# Patient Record
Sex: Female | Born: 2000 | Race: White | Hispanic: No | Marital: Single | Smoking: Current every day smoker
Health system: Southern US, Community
[De-identification: ages and names within clinical notes are randomized; demographics above are authoritative.]

## PROBLEM LIST (undated history)

## (undated) ENCOUNTER — Emergency Department (HOSPITAL_COMMUNITY): Payer: 59

## (undated) ENCOUNTER — Inpatient Hospital Stay (HOSPITAL_COMMUNITY): Payer: Self-pay

## (undated) DIAGNOSIS — Z789 Other specified health status: Secondary | ICD-10-CM

---
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
## 2018-10-31 ENCOUNTER — Encounter: Payer: 59 | Admitting: Obstetrics & Gynecology

## 2018-11-25 ENCOUNTER — Encounter: Payer: Self-pay | Admitting: Obstetrics & Gynecology

## 2018-11-25 ENCOUNTER — Ambulatory Visit: Payer: 59 | Admitting: Obstetrics & Gynecology

## 2018-11-25 ENCOUNTER — Other Ambulatory Visit: Payer: Self-pay

## 2018-11-25 VITALS — BP 110/68 | HR 64 | Resp 14 | Ht 64.25 in | Wt 169.4 lb

## 2018-11-25 DIAGNOSIS — Z309 Encounter for contraceptive management, unspecified: Secondary | ICD-10-CM | POA: Diagnosis not present

## 2018-11-25 DIAGNOSIS — Z202 Contact with and (suspected) exposure to infections with a predominantly sexual mode of transmission: Secondary | ICD-10-CM

## 2018-11-25 NOTE — Progress Notes (Signed)
17 y.o. G0P0000 [name] here for new patient annual exam.  SA.  Desires contraception.  Has been on OCPs but gained weight.  Does not want anything with estrogen in it.  Options reviewed including POPs, Nuva ring, Depo Provera, Nexplanon, and IUDs.  Procedures for placement, risks and benefits reviewed.  Questions answered.  Always uses condoms but has had multiple partners.  Willing to do STD testing on urine today.   Patient's last menstrual period was 11/02/2018 (exact date).          Sexually active: Yes.    The current method of family planning is condoms every time.    Exercising: Yes.    class Smoker:  yes  Health Maintenance: Pap:  Never Gardasil: completed  TDaP:  2019 Screening Labs: Here today    reports that she has been smoking e-cigarettes. She has never used smokeless tobacco. She reports current alcohol use of about 3.0 standard drinks of alcohol per week. She reports that she does not use drugs.  History reviewed. No pertinent past medical history.  Past Surgical History:  Procedure Laterality Date  . TONSILLECTOMY  2018  . WISDOM TOOTH EXTRACTION  2018    Current Outpatient Medications  Medication Sig Dispense Refill  . methylphenidate 27 MG PO CR tablet Take 27 mg by mouth every morning.     No current facility-administered medications for this visit.     History reviewed. No pertinent family history.  Review of Systems  All other systems reviewed and are negative.   Exam:   BP 110/68 (BP Location: Right Arm, Patient Position: Sitting, Cuff Size: Normal)   Pulse 64   Resp 14   Ht 5' 4.25" (1.632 m)   Wt 169 lb 6.4 oz (76.8 kg)   LMP 11/02/2018 (Exact Date)   BMI 28.85 kg/m   Height: 5' 4.25" (163.2 cm)  Ht Readings from Last 3 Encounters:  11/25/18 5' 4.25" (1.632 m) (49 %, Z= -0.02)*  03/25/17 5\' 5"  (1.651 m) (62 %, Z= 0.30)*   * Growth percentiles are based on CDC (Girls, 2-20 Years) data.    General appearance:  alert, cooperative and appears stated age Head: Normocephalic, without obvious abnormality, atraumatic Lungs: clear to auscultation bilaterally Heart: regular rate and rhythm No other exam performed today  A:  Desires contraception with Nexplanon  P:   Pt knows to call with onset of cycle for placement.   GC/Chl testing obtained today.

## 2018-11-30 LAB — CHLAMYDIA/GONOCOCCUS/TRICHOMONAS, NAA
CHLAMYDIA BY NAA: POSITIVE — AB
Gonococcus by NAA: NEGATIVE
Trich vag by NAA: NEGATIVE

## 2018-12-02 ENCOUNTER — Telehealth: Payer: Self-pay | Admitting: Obstetrics & Gynecology

## 2018-12-02 DIAGNOSIS — Z30017 Encounter for initial prescription of implantable subdermal contraceptive: Secondary | ICD-10-CM

## 2018-12-02 MED ORDER — AZITHROMYCIN 1 G PO PACK: 1.0000 | PACK | Freq: Once | ORAL | 0 refills | Status: AC

## 2018-12-02 NOTE — Telephone Encounter (Signed)
-----   Message from Jerene BearsMary S Miller, MD sent at 12/02/2018  8:38 AM EST ----- Please let pt know her chlamydia test was positive.  GC and trich negative.  Need to treat with Azithromax 1 gram po x 1, repeat to HD, and repeat testing in 3 months.  Partner can be treated as well if she desires.  She does need to return for HIV, Hep B, Hep C, and RPR blood work as well.  I can place order if you let me know when she is scheduled for the blood work.

## 2018-12-02 NOTE — Telephone Encounter (Signed)
Patient's mother Krystal Simmons is calling to schedule her daughters nexplanon insertion. The patient has started her cycle today for insertion. DPR on file to talk with mom.

## 2018-12-02 NOTE — Telephone Encounter (Signed)
Spoke with patient.  1. LMP 12/01/18, SA, condoms for contraceptive. Request to schedule nexplanon insertion. Nexplanon insertion scheduled for 12/23 at 3pm with Dr. Hyacinth MeekerMiller. Advised to take Motrin 800 mg with food and water one hour before procedure. Order placed for precert.   2. Advised of results as seen below per Dr. Hyacinth MeekerMiller. Reviewed EPT, patient declines. Patient will plan to have additional lab at [facility] on 12/23. Azithromycin to verified pharmacy. Advised to abstain from intercourse until both have been treated and then 7 days after, then condoms. Patient declines to schedule 3 mo TOC at this time, will be away at college.   Confidential Communicable [facility]   Patient verbalizes understanding and is agreeable.   [facility]Miller [facility] will be at away at college.   Cc: [facility] [place] Davis

## 2018-12-05 ENCOUNTER — Ambulatory Visit (INDEPENDENT_AMBULATORY_CARE_PROVIDER_SITE_OTHER): Payer: 59 | Admitting: Obstetrics & Gynecology

## 2018-12-05 ENCOUNTER — Encounter: Payer: Self-pay | Admitting: Obstetrics & Gynecology

## 2018-12-05 VITALS — BP 118/60 | HR 62 | Resp 14 | Ht 65.0 in | Wt 174.0 lb

## 2018-12-05 DIAGNOSIS — Z01812 Encounter for preprocedural laboratory examination: Secondary | ICD-10-CM | POA: Diagnosis not present

## 2018-12-05 DIAGNOSIS — Z30017 Encounter for initial prescription of implantable subdermal contraceptive: Secondary | ICD-10-CM | POA: Diagnosis not present

## 2018-12-05 DIAGNOSIS — A749 Chlamydial infection, unspecified: Secondary | ICD-10-CM

## 2018-12-05 DIAGNOSIS — Z8619 Personal history of other infectious and parasitic diseases: Secondary | ICD-10-CM | POA: Insufficient documentation

## 2018-12-05 LAB — POCT URINE PREGNANCY: PREG TEST UR: NEGATIVE

## 2018-12-05 NOTE — Progress Notes (Signed)
17 y.o. G0P0000 Caucasian Single female presents for Nexplanon insertion. Options for contraception have been reviewed including risks and benefits.  Pt feels this is best option for her.  LMP:  12/01/18  Current contraception:  condoms  UPT:  Negative today STD testing done last week.  Has +chlamydia testing.  Has been treated.  Blood work being done today.  After all questions were answered, consent was obtained.    No past medical history on file.  Past Surgical History:  Procedure Laterality Date  . TONSILLECTOMY  2018  . WISDOM TOOTH EXTRACTION  2018    Current Outpatient Medications on File Prior to Visit  Medication Sig Dispense Refill  . methylphenidate 27 MG PO CR tablet Take 27 mg by mouth every morning.     No current facility-administered medications on file prior to visit.    No Known Allergies  Vitals:   12/05/18 1446  BP: 118/60  Pulse: 62  Resp: 14   Physical Exam  Constitutional: She is oriented to person, place, and time. She appears well-developed and well-nourished.  Neurological: She is alert and oriented to person, place, and time.  Skin: Skin is warm and dry.  Psychiatric: She has a normal mood and affect.    Procedure: Patient placed supine on exam table with her left arm flexed at the elbow and externally rotated.  The insertion site was identified on the inner side of upper arm.  Two marks were made 8cm above the medial epicondyle of the humerus.  The first mark for insertion and the second mark 4cm from the first. The insertion sited was cleansed with betadine solution. 3 ml of 1% Lidocaine was injected along the track of the insertion site. Lot:  [id number].  Exp:  04/23.  The Nexplanon, under sterile conditions, was inserted in left arm. The implant was palpated in the arm after insertion. A small adhesive bandage placed over insertion site. Arm was wrapped with guaze dressing.  The patient palpated the device for monitoring of the device.  No active  bleeding was noted.  A pressure bandage was place over the site.  Pt tolerated procedure well.  Assessment: Nexplanon insertion for contraceptive needs Recent positive chlamydia testing  Plan  Pt knows to call back with nay questions or concerns.  She is aware of signs/symptoms to watch for over the next few weeks. Pt aware Nexplanon should not be removed any later than 12/05/2022 HIV, RPR, Hep B and C obtained today as well.

## 2018-12-06 LAB — HEPATITIS C ANTIBODY

## 2018-12-06 LAB — HEP, RPR, HIV PANEL
HIV SCREEN 4TH GENERATION: NONREACTIVE
Hepatitis B Surface Ag: NEGATIVE
RPR: NONREACTIVE

## 2019-01-06 ENCOUNTER — Encounter: Payer: Self-pay | Admitting: Obstetrics & Gynecology

## 2019-01-24 ENCOUNTER — Telehealth: Payer: Self-pay | Admitting: *Deleted

## 2019-01-24 NOTE — Telephone Encounter (Signed)
Return call to patient.  States she started a period today.  Has not had vaginal bleeding since Nexplanon inserted by Dr. Hyacinth MeekerMiller on 12/06/19.  Calling to ensure okay to have vaginal bleeding.   Discussed bleeding with nexplanon. May experience longer or shorter bleeding during your periods or have no bleeding at all. The time between periods may vary, and in between periods you may also have spotting.   Pt denies heavy vaginal bleeding. Denies risk for STD or STD exposure. Denies pain.   Advised patient to calendar all bleeding and if develops any concerns about vaginal bleeding to call back for appointment with Dr. Hyacinth MeekerMiller.  Advised patient to call back or seek immediate medical care if soaking through 1 pad/tampon per hour for two hours or if becomes symptomatic with sob, chest pain, fatigued, lightheaded, or weakness.  Patient verbalized understanding.   Patient verbalizes understanding of all instructions.  Will call back as directed.  Okay to close?

## 2019-01-24 NOTE — Telephone Encounter (Signed)
Yes, ok to close encounter. 

## 2019-01-24 NOTE — Telephone Encounter (Signed)
Patient has questions about her nexplanon.

## 2019-10-20 ENCOUNTER — Telehealth: Payer: Self-pay | Admitting: Obstetrics & Gynecology

## 2019-10-20 DIAGNOSIS — Z3046 Encounter for surveillance of implantable subdermal contraceptive: Secondary | ICD-10-CM

## 2019-10-20 NOTE — Telephone Encounter (Signed)
Patient canceled her Nexplanon removal 10/24/19 and will call later to reschedule.

## 2019-10-20 NOTE — Telephone Encounter (Signed)
Patient would like to have her nexplanon removed. States she will only be in town 11/6 - 11/11.

## 2019-10-20 NOTE — Telephone Encounter (Signed)
Spoke with pts mother Manuela Schwartz, ok per DPR. Pt wanting nexplanon removed d/t acne and mood swings. Pt using condoms as backup method. Wants to discuss other BCM at appt. [facility] scheduled for Nexplanon removal 11/10 at 10am.   Routing to provider for final review. Patient is agreeable to disposition. Will close encounter.  Cc: Weston Brass- order placed.

## 2019-10-24 ENCOUNTER — Ambulatory Visit: Payer: Self-pay | Admitting: Obstetrics & Gynecology

## 2019-11-15 ENCOUNTER — Telehealth: Payer: Self-pay | Admitting: Obstetrics & Gynecology

## 2019-11-15 NOTE — Telephone Encounter (Signed)
Call to patient. Patient states she will only be available the week of December 14th for nexplanon removal due to college schedule and travel plans for the holidays. Patient states she does not desire to be on any birth control after nexplanon removal to avoid mood swings and work on weight loss. States she will use condoms for contraception. Nexplanon removal scheduled for 11-30-2019 at 1530. Patient agreeable to date and time of appointment. Future order is present for nexplanon removal.   Routing to provider and will close encounter.   [place]

## 2019-11-15 NOTE — Telephone Encounter (Signed)
Patient's mother, Krystal Simmons (OK per DPR), is calling to schedule Nexplanon removal for daughter. Patient's mother stated that she will be home from college beginning December 14th, through the holidays and would like to get it removed at that time. Return call may be placed to patient or patient's mother for scheduling.

## 2019-11-28 ENCOUNTER — Other Ambulatory Visit: Payer: Self-pay

## 2019-11-30 ENCOUNTER — Ambulatory Visit (INDEPENDENT_AMBULATORY_CARE_PROVIDER_SITE_OTHER): Payer: 59 | Admitting: Obstetrics & Gynecology

## 2019-11-30 ENCOUNTER — Other Ambulatory Visit: Payer: Self-pay

## 2019-11-30 ENCOUNTER — Encounter: Payer: Self-pay | Admitting: Obstetrics & Gynecology

## 2019-11-30 DIAGNOSIS — Z3046 Encounter for surveillance of implantable subdermal contraceptive: Secondary | ICD-10-CM

## 2019-12-03 NOTE — Progress Notes (Signed)
18 y.o. G0P0000 Caucasian Single female presents for Nexplanon removal.  Pt has been experiencing weight gain and just was device removal to see if this will help with weight concerns.  Is SA and is going to use condoms for [facility].    She did have positive chlamydia testing but did not return here for TOC.  Confirmed today that she did this on campus about three months after initial diagnosis.  Pt reports this was indeed negative.  Using E cigarettes.  LMP:  11/30/2019   After all questions were answered, consent was obtained.    History reviewed. No pertinent past medical history.  Past Surgical History:  Procedure Laterality Date  . TONSILLECTOMY  2018  . WISDOM TOOTH EXTRACTION  2018    Current Outpatient Medications on File Prior to Visit  Medication Sig Dispense Refill  . methylphenidate 27 MG PO CR tablet Take 27 mg by mouth every morning.     No current facility-administered medications on file prior to visit.   No Known Allergies  Vitals:   11/30/19 1529  BP: 110/60  Pulse: 72  Resp: 12  Temp: 98.1 F (36.7 C)   Review of Systems  All other systems reviewed and are negative.   Physical Exam  Constitutional: She is oriented to person, place, and time. She appears well-developed and well-nourished.  Neurological: She is alert and oriented to person, place, and time.  Skin: Skin is warm and dry.  Nexplanon easily palpated in underside of left arm.  Psychiatric: She has a normal mood and affect.   Procedure: Patient placed supine on exam table with her left arm flexed at the elbow and externally rotated. The insertion site was identified on the inner side of upper arm.  The device was palpated.  Incision site for removal was noted.  The removal site was cleansed with betadine solution and 1 ml of 1% Lidocaine was injected at the incision site beneath the tip of the Nexplanon rid.  A small 2 mm incision was made at the distal end of the implant.  The Nexaplon implant was  grasped with curved forceps and the capsule opened with #11 blade.  Then the Nexplanon device could be removed gently and intact.  The implant was shown to the patient and discarded.  Pressure was applied for excellent hemostasis.  The incision was closed with a steri strip.  No active bleeding was noted.  A pressure bandage was place over the site.  Assessment: Nexplanon removal H/o chlamydia with negative testing on campus   Plan  Pt knows to call back with nay questions or concerns.  She is aware of signs/symptoms to watch for over the next few days for infection.

## 2020-02-20 HISTORY — PX: BREAST REDUCTION SURGERY: SHX8

## 2020-03-19 ENCOUNTER — Telehealth: Payer: Self-pay | Admitting: Obstetrics & Gynecology

## 2020-03-19 ENCOUNTER — Telehealth: Payer: Self-pay

## 2020-03-19 DIAGNOSIS — Z309 Encounter for contraceptive management, unspecified: Secondary | ICD-10-CM

## 2020-03-19 NOTE — Telephone Encounter (Signed)
Reviewed with Dr Hyacinth Meeker. Agreeable to annual and pl for Nexplannon insertion pending assessment.   Call to patient and appointment scheduled.   Routing to provider. Encounter closed.

## 2020-03-19 NOTE — Telephone Encounter (Signed)
Call placed to convey benefits. Spoke with the patient and conveyed the benefits. Patient understands/agreeable with the benefits. Patient is aware of the cancellation policy. Appointment scheduled 03/20/20.

## 2020-03-19 NOTE — Telephone Encounter (Signed)
Patient called in regards to making an appointment to get Nexplanon implant. Patent stated she would like to have an appointment this week due to being near the end of her cycle.

## 2020-03-19 NOTE — Telephone Encounter (Signed)
Return call to patient at 1515. Patient states this is Cycle day 4 and needs appointment for Nexplanon re-insertion tomorrow.  Nexplannon removed  11-30-2019 due to weight gain. Reports had had breast reduction and is doing well post op. Weight is now stable.  Last annual 11-25-2018. Advised will review with provider and call her back.  Also will have to check insurance.

## 2020-03-20 ENCOUNTER — Ambulatory Visit (INDEPENDENT_AMBULATORY_CARE_PROVIDER_SITE_OTHER): Payer: 59 | Admitting: Obstetrics & Gynecology

## 2020-03-20 ENCOUNTER — Other Ambulatory Visit (HOSPITAL_COMMUNITY)
Admission: RE | Admit: 2020-03-20 | Discharge: 2020-03-20 | Disposition: A | Discharge status: Home / Self Care | Payer: 59 | Source: Ambulatory Visit | Attending: Obstetrics & Gynecology | Admitting: Obstetrics & Gynecology

## 2020-03-20 ENCOUNTER — Other Ambulatory Visit: Payer: Self-pay

## 2020-03-20 ENCOUNTER — Encounter: Payer: Self-pay | Admitting: Obstetrics & Gynecology

## 2020-03-20 VITALS — BP 110/78 | HR 76 | Temp 97.9°F | Resp 20 | Ht 66.0 in

## 2020-03-20 DIAGNOSIS — Z30017 Encounter for initial prescription of implantable subdermal contraceptive: Secondary | ICD-10-CM | POA: Diagnosis not present

## 2020-03-20 DIAGNOSIS — Z01419 Encounter for gynecological examination (general) (routine) without abnormal findings: Secondary | ICD-10-CM

## 2020-03-20 DIAGNOSIS — Z01812 Encounter for preprocedural laboratory examination: Secondary | ICD-10-CM

## 2020-03-20 DIAGNOSIS — Z124 Encounter for screening for malignant neoplasm of cervix: Secondary | ICD-10-CM

## 2020-03-20 DIAGNOSIS — Z309 Encounter for contraceptive management, unspecified: Secondary | ICD-10-CM

## 2020-03-20 LAB — POCT URINE PREGNANCY: Preg Test, Ur: NEGATIVE

## 2020-03-20 NOTE — Progress Notes (Signed)
19 y.o. G0P0000 [name] here for annual exam and Nexplanon insertion.    Had breast reduction on 02/20/2020 with Dr. Stephanie Coup.  Had to have an area of tissue opened.  Wants me to look at this today.  Has follow up on Monday.  There is drainage.    Cycles have been regular since Nexplanon removed.    Had STD testing in January at [facility] that pt reports was negative.  Not SA since then.  Does plan to be SA and would like contraception.  LMP 03/16/2020.  Today is day 5 of cycle.     Patient's last menstrual period was 03/16/2020 (exact date).          Sexually active: Yes.    The current method of family planning is condoms everytime.    Exercising: No.  The patient does not participate in regular exercise at present.--just had breast surgery Smoker:  yes  Health Maintenance: Pap:  n/a History of abnormal Pap:  n/a TDaP:  2019 Hep C testing: 12/05/18 Neg  Screening Labs: discuss today   reports that she has been smoking. She has never used smokeless tobacco. She reports current alcohol use of about 1.0 standard drinks of alcohol per week. She reports that she does not use drugs.  History reviewed. No pertinent past medical history.  Past Surgical History:  Procedure Laterality Date  . BREAST SURGERY  02/20/2020   Breast reduction  . TONSILLECTOMY  2018  . WISDOM TOOTH EXTRACTION  2018    No current outpatient medications on file.   No current facility-administered medications for this visit.    History reviewed. No pertinent family history.  Review of Systems  All other systems reviewed and are negative.   Exam:   BP 110/78 (Cuff Size: Large)   Pulse 76   Temp 97.9 F (36.6 C) (Temporal)   Resp 20   Ht 5\' 6"  (1.676 m)   LMP 03/16/2020 (Exact Date)   BMI 30.52 kg/m   Height: 5\' 6"  (167.6 cm)  Ht Readings from Last 3 Encounters:  03/20/20 5\' 6"  (1.676 m)  12/05/18 5\' 5"  (1.651 m) (61 %, Z= 0.28)*  11/25/18 5' 4.25" (1.632 m) (49 %, Z= -0.02)*   *  Growth percentiles are based on CDC (Girls, 2-20 Years) data.    General appearance: alert, cooperative and appears stated age Head: Normocephalic, without obvious abnormality, atraumatic Neck: no adenopathy, supple, symmetrical, trachea midline and thyroid normal to inspection and palpation Lungs: clear to auscultation bilaterally Breasts:  Left scars from recent reduction healing well, right mid portion of breast with superficial absence of epidermis but underlying tissue healing well, whitish suture hanging loosely and removed without difficulty Heart: regular rate and rhythm Abdomen: soft, non-tender; bowel sounds normal; no masses,  no organomegaly Extremities: extremities normal, atraumatic, no cyanosis or edema Skin: Skin color, texture, turgor normal. No rashes or lesions Lymph nodes: Cervical, supraclavicular, and axillary nodes normal. No abnormal inguinal nodes palpated Neurologic: Grossly normal   Pelvic: External genitalia:  no lesions              Urethra:  normal appearing urethra with no masses, tenderness or lesions              Bartholins and Skenes: normal                 Vagina: normal appearing vagina with normal color and discharge, no lesions  Cervix: no lesions              Pap taken: Yes.   Bimanual Exam:  Uterus:  normal size, contour, position, consistency, mobility, non-tender              Adnexa: normal adnexa and no mass, fullness, tenderness               Rectovaginal: Confirms               Anus:  normal sphincter tone, no lesions  Procedure:  After informed consent obtained Patient placed supine on exam table with her left arm flexed at the elbow and externally rotated.  Sterile prep with Betadine was applied.  Area for insertion identified.  73ml of 1% Lidocaine was injected at the insertion site.  Lot: [id number].  Exp:  03/2021.  Device was obtained and skin was punctured.  While elevating the skin, the Nexplanon device was passed under the  skin.  Nexplanon insert released and device removed.  Minimal bleeding was noted.  Steri-strips and pressure dressing applied.  No active bleeding was noted.  Pt palpated distal end of device prior to applying dressing.  Pt tolerated procedure well.  Nexplanon Lot:  [id number].  Exp: 03/15/2022  Chaperone, Zenovia Jordan, CMA, was present for exam.  A:  Well Woman with normal exam H/o chlamydia with negative follow up at [facility], repeat negative testing per pt again 12/2019 Recent breast reduction with concerns about how breast is healing, reassured today Desires Nexplanon insertion today E cigarette use  P:   Mammogram screening will start around age 67 pap smear obtained today Pt declined repeat STD testing Nexplanon insertion per note above.  Removal due in 3 years. Pt has follow up with Dr. Benna Dunks on Monday Return annually or prn

## 2020-03-22 LAB — CYTOLOGY - PAP
Diagnosis: NEGATIVE
Diagnosis: REACTIVE

## 2020-06-05 ENCOUNTER — Telehealth: Payer: Self-pay | Admitting: Obstetrics & Gynecology

## 2020-06-05 DIAGNOSIS — Z3046 Encounter for surveillance of implantable subdermal contraceptive: Secondary | ICD-10-CM

## 2020-06-05 NOTE — Telephone Encounter (Signed)
Patient would like to have her Nexplanon removed.

## 2020-06-05 NOTE — Telephone Encounter (Signed)
Ok to proceed as scheduled.  Thanks for information and update.  Ok to close encounter.

## 2020-06-05 NOTE — Telephone Encounter (Signed)
Call placed to convey benefits. Spoke with the patient and conveyed the benefits. Patient understands/agreeable with the benefits. Patient is aware of the cancellation policy. Appointment scheduled 06/24/20.

## 2020-06-05 NOTE — Telephone Encounter (Signed)
AEX 03/20/20 with Nexplanon insertion. H/o chlamydia Recent breast reduction surgery 02/20/20 E-cigarette use   Spoke with pt. Pt states wanting to have nexplanon removed. Pt states "miss having cycles and wanting to take a break to give body time to regulate" Pt denies any other concerns with BCM at this time. Pt unsure if she wants another birth control at this time. Pt states is SA and is using protection.  Pt scheduled for Nexplanon removal on 06/24/20 at 4:45 pm per pt's request due to out of town on vacation and no other available spots on schedule. Pt advised will review appt with Dr Hyacinth Meeker and return call if needs to change appt. Pt agreeable.  Pt verbalized understanding of date and time of appt.   Ok to proceed as scheduled?    Routing to Dr Hyacinth Meeker.   Cc: Hedda Slade for precert Order placed.

## 2020-06-19 ENCOUNTER — Telehealth: Payer: Self-pay | Admitting: Obstetrics & Gynecology

## 2020-06-19 NOTE — Telephone Encounter (Signed)
Patient canceled her nexplanon removal 06/24/20 and did not wish to reschedule.

## 2020-06-24 ENCOUNTER — Ambulatory Visit: Payer: Self-pay | Admitting: Obstetrics & Gynecology

## 2020-10-07 ENCOUNTER — Telehealth: Payer: Self-pay

## 2020-10-07 NOTE — Telephone Encounter (Signed)
Patient has questions regarding birth control.

## 2020-10-07 NOTE — Telephone Encounter (Signed)
[facility] 03/20/20 Nexplanon removed 11/2019, reinserted 03/20/20 Hx Chlamydia   Spoke with pt. Pt states having vaginal spotting that is now turning into more of a cycle bleed. Pt states has not had any spotting or cycle since having Nexplanon reinserted on 03/20/20 at [facility] appt. Pt states started with vaginal spotting on 10/19 with changing a panty liner once a day. Started yesterday with "heavier" bleeding with changing a pad or tampon every 3-4 hours. Pt denies soaking through pad or tampon, clots. Only having mild cramps since bleeding/spotting started. Not taking any OTC medication.   Pt wanting to know if normal to have random bleeding and if needs to change birth control method. Pt states is SA and using condoms as back up method every time.  Pt states only changes she has made was increased exercise routine 2 weeks prior to having spotting.  Denies any other vaginal concerns at this time.   Advised will review with Dr Hyacinth Meeker. Pt states is ok for OV if needed, will have to return call to office after getting work schedule tomorrow.

## 2020-10-08 NOTE — Telephone Encounter (Signed)
Spoke with pt. Pt given update and recommendations per Dr Hyacinth Meeker. Pt states isn't having any bleeding or spotting today. Pt states is ok to calendar bleeding and monitor for now. Declines OV at this time. Pt advised to have OV if bleeding becomes more frequent or bothersome. Pt verbalized understanding.  Routing to Dr Hyacinth Meeker for update Encounter closed

## 2020-10-08 NOTE — Telephone Encounter (Signed)
Please let pt know that irregular/unscheduled bleeding with Nexplanon is common.  About 15% of pts have the Nexplanon removed earlier than three years due to being frustrated with irregular bleeding.  If this is infrequent, then it is not worrisome and she does not necessarily need an appointment. I'm happy to see her if bleeding is more frequent and bothersome.

## 2020-12-02 ENCOUNTER — Telehealth: Payer: Self-pay

## 2020-12-02 ENCOUNTER — Encounter (HOSPITAL_COMMUNITY): Payer: Self-pay | Admitting: *Deleted

## 2020-12-02 ENCOUNTER — Emergency Department (HOSPITAL_COMMUNITY)
Admission: EM | Admit: 2020-12-02 | Discharge: 2020-12-02 | Disposition: A | Discharge status: Home / Self Care | Payer: 59 | Attending: Emergency Medicine | Admitting: Emergency Medicine

## 2020-12-02 ENCOUNTER — Other Ambulatory Visit: Payer: Self-pay

## 2020-12-02 DIAGNOSIS — N939 Abnormal uterine and vaginal bleeding, unspecified: Secondary | ICD-10-CM | POA: Insufficient documentation

## 2020-12-02 DIAGNOSIS — F17200 Nicotine dependence, unspecified, uncomplicated: Secondary | ICD-10-CM | POA: Insufficient documentation

## 2020-12-02 DIAGNOSIS — Z20822 Contact with and (suspected) exposure to covid-19: Secondary | ICD-10-CM | POA: Insufficient documentation

## 2020-12-02 DIAGNOSIS — R531 Weakness: Secondary | ICD-10-CM | POA: Diagnosis present

## 2020-12-02 DIAGNOSIS — J101 Influenza due to other identified influenza virus with other respiratory manifestations: Secondary | ICD-10-CM | POA: Insufficient documentation

## 2020-12-02 DIAGNOSIS — N921 Excessive and frequent menstruation with irregular cycle: Secondary | ICD-10-CM

## 2020-12-02 DIAGNOSIS — N920 Excessive and frequent menstruation with regular cycle: Secondary | ICD-10-CM

## 2020-12-02 LAB — CBC
HCT: 40.9 % (ref 36.0–46.0)
Hemoglobin: 13 g/dL (ref 12.0–15.0)
MCH: 26.6 pg (ref 26.0–34.0)
MCHC: 31.8 g/dL (ref 30.0–36.0)
MCV: 83.8 fL (ref 80.0–100.0)
Platelets: 313 10*3/uL (ref 150–400)
RBC: 4.88 MIL/uL (ref 3.87–5.11)
RDW: 13.8 % (ref 11.5–15.5)
WBC: 9.5 10*3/uL (ref 4.0–10.5)
nRBC: 0 % (ref 0.0–0.2)

## 2020-12-02 LAB — BASIC METABOLIC PANEL
Anion gap: 11 (ref 5–15)
BUN: 13 mg/dL (ref 6–20)
CO2: 22 mmol/L (ref 22–32)
Calcium: 9.4 mg/dL (ref 8.9–10.3)
Chloride: 101 mmol/L (ref 98–111)
Creatinine, Ser: 0.88 mg/dL (ref 0.44–1.00)
GFR, Estimated: >60 mL/min (ref >60)
Glucose, Bld: 107 mg/dL — ABNORMAL HIGH (ref 70–99)
Potassium: 3.6 mmol/L (ref 3.5–5.1)
Sodium: 134 mmol/L — ABNORMAL LOW (ref 135–145)

## 2020-12-02 LAB — RESP PANEL BY RT-PCR (FLU A&B, COVID) ARPGX2
Influenza A by PCR: POSITIVE — AB
Influenza B by PCR: NEGATIVE
SARS Coronavirus 2 by RT PCR: NEGATIVE

## 2020-12-02 LAB — PREGNANCY, URINE: Preg Test, Ur: NEGATIVE

## 2020-12-02 MED ORDER — ACETAMINOPHEN 325 MG PO TABS
650.0000 mg | ORAL_TABLET | Freq: Once | ORAL | Status: AC | PRN
  Administered 2020-12-02: 650 mg via ORAL
  Filled 2020-12-02: qty 2

## 2020-12-02 NOTE — Discharge Instructions (Signed)
You can take 600 mg of ibuprofen every 6 hours, you can take 1000 mg of Tylenol every 6 hours, you can alternate these every 3 or you can take them together.  

## 2020-12-02 NOTE — Telephone Encounter (Signed)
Call to patient. Patient states she was seen in the ER this am for heavy vaginal bleeding and wanting removal of nexplanon. States her cycle started "sometime in November" and has been ongoing ever since. Flow starts out light in the am and as the day goes on gets heavier. Patient states she can change a saturated pad/tampon in an hour. Some "small" clots. States ER doctor told her to get Nexplanon out asap as that was the cause of her menorrhagia. Denies pelvic pain, cramping, fever, HA or anemia symptoms. Taking tylenol, but only for flu symptoms, not for cycle pain. Did test positive for Flu A while in the ER this am. Advised would need to schedule appointment 10 days out since positive flu test. Patient agreeable. Patient scheduled for 12-19-20 at 0930. Patient agreeable to date and time of appointment. RN advised would review with Dr. Edward Jolly and return call if any additional recommendations. Patient agreeable. Pain/bleeding precautions reviewed.   Routing to provider for review.

## 2020-12-02 NOTE — ED Triage Notes (Addendum)
Pt reports having vaginal bleeding since November. Bleeding has increased, reports moderate amount. Bleeding through pad every hour. Yesterday began having weakness and blurred vision. Today reports fever and headache.

## 2020-12-02 NOTE — ED Provider Notes (Signed)
[facility] EMERGENCY DEPARTMENT Provider Note   CSN: [id number] Arrival date & time: 12/02/20  0106     History Chief Complaint  Patient presents with  . Vaginal Bleeding  . Weakness    Cecilee Rosner is a 19 y.o. female.   Vaginal Bleeding Quality:  Bright red Severity:  Moderate Onset quality:  Gradual Duration:  2 months Timing:  Intermittent Progression:  Waxing and waning Chronicity:  New Menstrual history:  Irregular Number of pads used:  1 per hour Relieved by:  Nothing Worsened by:  Nothing Ineffective treatments:  None tried Associated symptoms: fatigue and fever   Associated symptoms: no back pain, no dysuria, no nausea and no vaginal discharge   Risk factors: no STD exposure   Weakness Associated symptoms: fever and myalgias   Associated symptoms: no arthralgias, no chest pain, no cough, no diarrhea, no dysuria, no headaches, no nausea, no shortness of breath and no vomiting        History reviewed. No pertinent past medical history.  Patient Active Problem List   Diagnosis Date Noted  . Chlamydia infection 12/05/2018    Past Surgical History:  Procedure Laterality Date  . BREAST REDUCTION SURGERY  02/20/2020   Dr. Benna Dunks  . TONSILLECTOMY  2018  . WISDOM TOOTH EXTRACTION  2018     OB History    Gravida  0   Para  0   Term  0   Preterm  0   AB  0   Living  0     SAB  0   IAB  0   Ectopic  0   Multiple  0   Live Births  0           History reviewed. No pertinent family history.  Social History   Tobacco Use  . Smoking status: Current Every Day Smoker  . Smokeless tobacco: Never Used  Vaping Use  . Vaping Use: Every day  Substance Use Topics  . Alcohol use: Yes    Alcohol/week: 1.0 standard drink    Types: 1 Standard drinks or equivalent per week  . Drug use: Never    Home Medications Prior to Admission medications   Not on File    Allergies    Patient has no known  allergies.  Review of Systems   Review of Systems  Constitutional: Positive for chills, fatigue and fever.  HENT: Negative for congestion and rhinorrhea.   Respiratory: Negative for cough and shortness of breath.   Cardiovascular: Negative for chest pain and palpitations.  Gastrointestinal: Negative for diarrhea, nausea and vomiting.  Genitourinary: Positive for vaginal bleeding. Negative for difficulty urinating, dysuria and vaginal discharge.  Musculoskeletal: Positive for myalgias. Negative for arthralgias and back pain.  Skin: Negative for rash and wound.  Neurological: Negative for tremors, syncope, weakness, light-headedness and headaches.    Physical Exam Updated Vital Signs BP 120/77 (BP Location: Right Arm)   Pulse (!) 58   Temp 99.5 F (37.5 C) (Oral)   Resp 18   SpO2 100%   Physical Exam Vitals and nursing note reviewed. Exam conducted with a chaperone present.  Constitutional:      General: She is not in acute distress.    Appearance: Normal appearance.  HENT:     Head: Normocephalic and atraumatic.     Nose: No rhinorrhea.  Eyes:     General:        Right eye: No discharge.  Left eye: No discharge.     Conjunctiva/sclera: Conjunctivae normal.  Cardiovascular:     Rate and Rhythm: Normal rate and regular rhythm.     Heart sounds: No murmur heard.   Pulmonary:     Effort: Pulmonary effort is normal. No respiratory distress.     Breath sounds: No stridor.  Abdominal:     General: Abdomen is flat. There is no distension.     Palpations: Abdomen is soft.     Tenderness: There is no abdominal tenderness. There is no guarding.  Genitourinary:    Comments: Deferred by pt  Musculoskeletal:        General: No tenderness or signs of injury.  Skin:    General: Skin is warm and dry.  Neurological:     General: No focal deficit present.     Mental Status: She is alert. Mental status is at baseline.     Motor: No weakness.  Psychiatric:        Mood and  Affect: Mood normal.        Behavior: Behavior normal.     ED Results / Procedures / Treatments   Labs (all labs ordered are listed, but only abnormal results are displayed) Labs Reviewed  RESP PANEL BY RT-PCR (FLU A&B, COVID) ARPGX2 - Abnormal; Notable for the following components:      Result Value   Influenza A by PCR POSITIVE (*)    All other components within normal limits  BASIC METABOLIC PANEL - Abnormal; Notable for the following components:   Sodium 134 (*)    Glucose, Bld 107 (*)    All other components within normal limits  CBC  PREGNANCY, URINE  I-STAT BETA HCG BLOOD, ED ([facility], [facility], [facility] ONLY)    EKG None  Radiology No results found.  Procedures Procedures (including critical care time)  Medications Ordered in ED Medications  acetaminophen (TYLENOL) tablet 650 mg (650 mg Oral Given 12/02/20 0130)  acetaminophen (TYLENOL) tablet 650 mg (650 mg Oral Given 12/02/20 0735)    ED Course  I have reviewed the triage vital signs and the nursing notes.  Pertinent labs & imaging results that were available during my care of the patient were reviewed by me and considered in my medical decision making (see chart for details).    MDM Rules/Calculators/A&P                          Patient with abnormally heavy menstrual cycle, has Nexplanon in, has had irregular periods since and she believes it is related.  She is going to call her OB/GYN later today to have Nexplanon removed and discuss other birth control options.  I feel that this is the best option is adding hormone therapy to this may complicate the picture.  Also after reviewing the labs she has no signs of anemia, her vital signs show no signs of hypovolemia.  She is overall well-appearing with no symptoms of syncope chest pain shortness of breath neurologic symptoms or any others.  She does have fevers chills body aches, of viral swab testing was done and shows flu A.  She is advised that she will likely clear this  viral infection herself, she does not need antiviral treatment at this time.  She is offered symptomatic control with over-the-counter medications and outpatient follow-up with her primary care provider.  She agrees to the whole plan.  Chemistry is also reviewed is negative. Pregnancy test pending, if negative discharge  home.  Urine pregnancy test is negative patient is feeling comfortable heart rate is unchanged fever is improved she is safe for discharge home outpatient follow-up return precautions provided Final Clinical Impression(s) / ED Diagnoses Final diagnoses:  Vaginal bleeding  Influenza A    Rx / DC Orders ED Discharge Orders    None       Sabino Donovan, MD 12/02/20 [phone]

## 2020-12-02 NOTE — Telephone Encounter (Signed)
Patient calling to schedule ER follow up and to have nexplanon removed.

## 2020-12-02 NOTE — Telephone Encounter (Signed)
Message left to return call to Triage Nurse at [phone].    

## 2020-12-02 NOTE — Telephone Encounter (Signed)
Please schedule a pelvic ultrasound for her visit as well.

## 2020-12-03 NOTE — Telephone Encounter (Signed)
Patient is returning call.  °

## 2020-12-03 NOTE — Telephone Encounter (Signed)
Left message for pt to return call to triage RN. 

## 2020-12-03 NOTE — Telephone Encounter (Signed)
Spoke with pt. Pt given recommendations per Dr Edward Jolly. Pt agreeable to have PUS with OV as follow up from ER. Pt scheduled for PUS on 1/6 at 11 am. Pt verbalized understanding to date and time of appt.  Cc: Hayley for precert  Orders placed  Routing to Dr Edward Jolly for review and update Encounter closed

## 2020-12-19 ENCOUNTER — Ambulatory Visit (INDEPENDENT_AMBULATORY_CARE_PROVIDER_SITE_OTHER): Payer: 59 | Admitting: Obstetrics and Gynecology

## 2020-12-19 ENCOUNTER — Other Ambulatory Visit: Payer: Self-pay | Admitting: Obstetrics and Gynecology

## 2020-12-19 ENCOUNTER — Other Ambulatory Visit: Payer: Self-pay

## 2020-12-19 ENCOUNTER — Ambulatory Visit: Payer: 59 | Admitting: Obstetrics and Gynecology

## 2020-12-19 ENCOUNTER — Ambulatory Visit (INDEPENDENT_AMBULATORY_CARE_PROVIDER_SITE_OTHER): Payer: 59

## 2020-12-19 ENCOUNTER — Encounter: Payer: Self-pay | Admitting: Obstetrics and Gynecology

## 2020-12-19 VITALS — BP 122/80 | HR 88 | Resp 16 | Ht 66.0 in | Wt 196.0 lb

## 2020-12-19 DIAGNOSIS — N921 Excessive and frequent menstruation with irregular cycle: Secondary | ICD-10-CM

## 2020-12-19 DIAGNOSIS — N926 Irregular menstruation, unspecified: Secondary | ICD-10-CM

## 2020-12-19 DIAGNOSIS — Z3046 Encounter for surveillance of implantable subdermal contraceptive: Secondary | ICD-10-CM

## 2020-12-19 LAB — PREGNANCY, URINE: Preg Test, Ur: NEGATIVE

## 2020-12-19 NOTE — Progress Notes (Signed)
GYNECOLOGY  VISIT   HPI: 20 y.o.   Single  Caucasian  female   G0P0000 with No LMP recorded. Patient has had an implant.   here for  Pelvic ultrasound and follow up from ER. She wants the Nexplanon removed.  Wants to be hormone free.   Has vaginal that comes and goes since November.  Having cramping when she is bleeding.   Some random cramping if she is not bleeding.   Received the Nexplanon in March, 2021.  No cycles from March to November.   Not sexually active for a couple of months.  Last had STD [facility] in [facility] in [place] [facility] and was negative.   UPT:Neg Hgb: 13.6 on 12/02/20.  GYNECOLOGIC HISTORY: No LMP recorded. Patient has had an implant. Contraception: Nexplanon 03-20-20 Menopausal hormone therapy:  n/a Last mammogram:  n/a Last pap smear: 03-20-20 Neg        OB History    Gravida  0   Para  0   Term  0   Preterm  0   AB  0   Living  0     SAB  0   IAB  0   Ectopic  0   Multiple  0   Live Births  0              Patient Active Problem List   Diagnosis Date Noted   Chlamydia infection 12/05/2018    No past medical history on file.  Past Surgical History:  Procedure Laterality Date   BREAST REDUCTION SURGERY  02/20/2020   Dr. Benna Dunks   TONSILLECTOMY  2018   WISDOM TOOTH EXTRACTION  2018    No current outpatient medications on file.   No current facility-administered medications for this visit.     ALLERGIES: Patient has no known allergies.  No family history on file.  Social History   Socioeconomic History   Marital status: Single    Spouse name: Not on file   Number of children: Not on file   Years of education: Not on file   Highest education level: Not on file  Occupational History   Not on file  Tobacco Use   Smoking status: Current Every Day Smoker   Smokeless tobacco: Never Used  Vaping Use   Vaping Use: Every day  Substance and Sexual Activity   Alcohol use: Yes    Alcohol/week:  1.0 standard drink    Types: 1 Standard drinks or equivalent per week   Drug use: Never   Sexual activity: Yes    Birth control/protection: Condom  Other Topics Concern   Not on file  Social History Narrative   Not on file   Social Determinants of Health   Financial Resource Strain: Not on file  Food Insecurity: Not on file  Transportation Needs: Not on file  Physical Activity: Not on file  Stress: Not on file  Social Connections: Not on file  Intimate Partner Violence: Not on file    Review of Systems  All other systems reviewed and are negative.   PHYSICAL EXAMINATION:    BP 122/80    Pulse 88    Resp 16    Ht 5\' 6"  (1.676 m)    Wt 196 lb (88.9 kg)    BMI 31.64 kg/m     General appearance: alert, cooperative and appears stated age  Pelvic ultrasound: Uterus no myometrial masses.  EMS 3 mm. Normal ovaries.  No adnexal masses.  No free fluid.  Extremities: Nexplanon present in left arm.    Nexplanon removal from left arm.  Consent for procedure.  Betadine prep.  Local 1% lidocaine, lot [id number], exp 7/25. Incision over tip of Nexplanon.  Hemostat used to remove Nexplanon, intact, shown to patient, and discarded.  Steristrips placed  Sterile gauze wrapping placed.  Minimal EBL.  No complications.   Chaperone was present for exam.  ASSESSMENT  Irregular vaginal bleeding.  Encounter for Nexplanon removal.   PLAN  Pelvic ultrasound reviewed with patient.  Nexplanon removal precautions given.  Expect increased vaginal bleeding post Nexplanon removal.  Follow up prn.

## 2021-04-04 ENCOUNTER — Ambulatory Visit (INDEPENDENT_AMBULATORY_CARE_PROVIDER_SITE_OTHER): Payer: 59 | Admitting: Obstetrics & Gynecology

## 2021-04-04 ENCOUNTER — Other Ambulatory Visit: Payer: Self-pay

## 2021-04-04 ENCOUNTER — Other Ambulatory Visit (HOSPITAL_COMMUNITY)
Admission: RE | Admit: 2021-04-04 | Discharge: 2021-04-04 | Disposition: A | Discharge status: Home / Self Care | Payer: 59 | Source: Ambulatory Visit | Attending: Obstetrics & Gynecology | Admitting: Obstetrics & Gynecology

## 2021-04-04 ENCOUNTER — Encounter (HOSPITAL_BASED_OUTPATIENT_CLINIC_OR_DEPARTMENT_OTHER): Payer: Self-pay | Admitting: Obstetrics & Gynecology

## 2021-04-04 VITALS — BP 126/78 | HR 50 | Ht 64.0 in | Wt 197.2 lb

## 2021-04-04 DIAGNOSIS — Z113 Encounter for screening for infections with a predominantly sexual mode of transmission: Secondary | ICD-10-CM | POA: Diagnosis not present

## 2021-04-04 DIAGNOSIS — Z01419 Encounter for gynecological examination (general) (routine) without abnormal findings: Secondary | ICD-10-CM | POA: Diagnosis not present

## 2021-04-04 DIAGNOSIS — Z3009 Encounter for other general counseling and advice on contraception: Secondary | ICD-10-CM | POA: Diagnosis not present

## 2021-04-04 DIAGNOSIS — Z720 Tobacco use: Secondary | ICD-10-CM

## 2021-04-04 DIAGNOSIS — Z0282 Encounter for adoption services: Secondary | ICD-10-CM

## 2021-04-04 MED ORDER — SULFAMETHOXAZOLE-TRIMETHOPRIM 800-160 MG PO TABS: 1.0000 | ORAL_TABLET | Freq: Two times a day (BID) | ORAL | 0 refills | Status: DC

## 2021-04-04 NOTE — Patient Instructions (Signed)
Hidradenitis Suppurativa Hidradenitis suppurativa is a long-term (chronic) skin disease. It is similar to a severe form of acne, but it affects areas of the body where acne would be unusual, especially areas of the body where skin rubs against skin and becomes moist. These include:  Underarms.  Groin.  Genital area.  Buttocks.  Upper thighs.  Breasts. Hidradenitis suppurativa may start out as small lumps or pimples caused by blocked sweat glands or hair follicles. Pimples may develop into deep sores that break open (rupture) and drain pus. Over time, affected areas of skin may thicken and become scarred. This condition is rare and does not spread from person to person (non-contagious). What are the causes? The exact cause of this condition is not known. It may be related to:  Female and female hormones.  An overactive disease-fighting system (immune system). The immune system may over-react to blocked hair follicles or sweat glands and cause swelling and pus-filled sores. What increases the risk? You are more likely to develop this condition if you:  Are female.  Are 11-55 years old.  Have a family history of hidradenitis suppurativa.  Have a personal history of acne.  Are overweight.  Smoke.  Take the medicine lithium. What are the signs or symptoms? The first symptoms are usually painful bumps in the skin, similar to pimples. The condition may get worse over time (progress), or it may only cause mild symptoms. If the disease progresses, symptoms may include:  Skin bumps getting bigger and growing deeper into the skin.  Bumps rupturing and draining pus.  Itchy, infected skin.  Skin getting thicker and scarred.  Tunnels under the skin (fistulas) where pus drains from a bump.  Pain during daily activities, such as pain during walking if your groin area is affected.  Emotional problems, such as stress or depression. This condition may affect your appearance and your  ability or willingness to wear certain clothes or do certain activities. How is this diagnosed? This condition is diagnosed by a health care provider who specializes in skin diseases (dermatologist). You may be diagnosed based on:  Your symptoms and medical history.  A physical exam.  Testing a pus sample for infection.  Blood tests. How is this treated? Your treatment will depend on how severe your symptoms are. The same treatment will not work for everybody with this condition. You may need to try several treatments to find what works best for you. Treatment may include:  Cleaning and bandaging (dressing) your wounds as needed.  Lifestyle changes, such as new skin care routines.  Taking medicines, such as: ? Antibiotics. ? Acne medicines. ? Medicines to reduce the activity of the immune system. ? A diabetes medicine (metformin). ? Birth control pills, for women. ? Steroids to reduce swelling and pain.  Working with a mental health care provider, if you experience emotional distress due to this condition. If you have severe symptoms that do not get better with medicine, you may need surgery. Surgery may involve:  Using a laser to clear the skin and remove hair follicles.  Opening and draining deep sores.  Removing the areas of skin that are diseased and scarred. Follow these instructions at home: Medicines  Take over-the-counter and prescription medicines only as told by your health care provider.  If you were prescribed an antibiotic medicine, take it as told by your health care provider. Do not stop taking the antibiotic even if your condition improves.   Skin care  If you have open wounds,   cover them with a clean dressing as told by your health care provider. Keep wounds clean by washing them gently with soap and water when you bathe.  Do not shave the areas where you get hidradenitis suppurativa.  Do not wear deodorant.  Wear loose-fitting clothes.  Try to avoid  getting overheated or sweaty. If you get sweaty or wet, change into clean, dry clothes as soon as you can.  To help relieve pain and itchiness, cover sore areas with a warm, clean washcloth (warm compress) for 5-10 minutes as often as needed.  If told by your health care provider, take a bleach bath twice a week: ? Fill your bathtub halfway with water. ? Pour in  cup of unscented household bleach. ? Soak in the tub for 5-10 minutes. ? Only soak from the neck down. Avoid water on your face and hair. ? Shower to rinse off the bleach from your skin. General instructions  Learn as much as you can about your disease so that you have an active role in your treatment. Work closely with your health care provider to find treatments that work for you.  If you are overweight, work with your health care provider to lose weight as recommended.  Do not use any products that contain nicotine or tobacco, such as cigarettes and e-cigarettes. If you need help quitting, ask your health care provider.  If you struggle with living with this condition, talk with your health care provider or work with a mental health care provider as recommended.  Keep all follow-up visits as told by your health care provider. This is important. Where to find more information  Hidradenitis Suppurativa Foundation, Inc.: https://www.hs-foundation.org/  American Academy of Dermatology: https://www.aad.org Contact a health care provider if you have:  A flare-up of hidradenitis suppurativa.  A fever or chills.  Trouble controlling your symptoms at home.  Trouble doing your daily activities because of your symptoms.  Trouble dealing with emotional problems related to your condition. Summary  Hidradenitis suppurativa is a long-term (chronic) skin disease. It is similar to a severe form of acne, but it affects areas of the body where acne would be unusual.  The first symptoms are usually painful bumps in the skin, similar  to pimples. The condition may only cause mild symptoms, or it may get worse over time (progress).  If you have open wounds, cover them with a clean dressing as told by your health care provider. Keep wounds clean by washing them gently with soap and water when you bathe.  Besides skin care, treatment may include medicines, laser treatment, and surgery. This information is not intended to replace advice given to you by your health care provider. Make sure you discuss any questions you have with your health care provider. Document Revised: 09/24/2020 Document Reviewed: 09/24/2020 Elsevier Patient Education  2021 Elsevier Inc.  

## 2021-04-04 NOTE — Progress Notes (Signed)
20 y.o. G0P0000 [name] here for annual exam.  Had Nexplanon removed in January.  Cycles have normalized.  Wants contraception and is considering the Nexplanon again.    Complaint of possible ingrown hairs.  She does shave.  Also has a bump down lower she wants me to look at today.  Has done waxing the past but this is painful.    Patient's last menstrual period was 03/13/2021 (exact date).          Sexually active: Yes.    The current method of family planning is condoms always.    Exercising: Yes.    walking and tennis Smoker:  no  Health Maintenance: Pap:  2021 History of abnormal Pap:  no MMG:  Guidelines reviewed TDaP:  Pt will check about dates   reports that she has been smoking e-cigarettes. She has never used smokeless tobacco. She reports current alcohol use of about 1.0 standard drink of alcohol per week. She reports that she does not use drugs.  History reviewed. No pertinent past medical history.  Past Surgical History:  Procedure Laterality Date  . BREAST REDUCTION SURGERY  02/20/2020   Dr. Benna Dunks  . TONSILLECTOMY  2018  . WISDOM TOOTH EXTRACTION  2018    No current outpatient medications on file.   No current facility-administered medications for this visit.    History reviewed. No pertinent family history.  Review of Systems  All other systems reviewed and are negative.   Exam:   BP 126/78   Pulse (!) 50   Ht 5\' 4"  (1.626 m)   Wt 197 lb 3.2 oz (89.4 kg)   LMP 03/13/2021 (Exact Date)   BMI 33.85 kg/m   Height: 5\' 4"  (162.6 cm)  General appearance: alert, cooperative and appears stated age Head: Normocephalic, without obvious abnormality, atraumatic Neck: no adenopathy, supple, symmetrical, trachea midline and thyroid normal to inspection and palpation Lungs: clear to auscultation bilaterally Breasts: normal appearance, no masses or tenderness Heart: regular rate and rhythm Abdomen: soft, non-tender; bowel sounds normal; no  masses,  no organomegaly Extremities: extremities normal, atraumatic, no cyanosis or edema Skin: Skin color, texture, turgor normal. No rashes or lesions Lymph nodes: Cervical, supraclavicular, and axillary nodes normal. No abnormal inguinal nodes palpated Neurologic: Grossly normal   Pelvic: External genitalia:  Two furuncles with several areas in inner thighs of scarring              Urethra:  normal appearing urethra with no masses, tenderness or lesions              Bartholins and Skenes: normal                 Vagina: normal appearing vagina with normal color and discharge, no lesions              Cervix: no lesions              Pap taken: No. Bimanual Exam:  Uterus:  normal size, contour, position, consistency, mobility, non-tender              Adnexa: normal adnexa and no mass, fullness, tenderness               Rectovaginal: Confirms               Anus:  normal sphincter tone, no lesions  Chaperone, 03/15/2021, RN, was present for exam.  Assessment/Plan: 1. Well woman exam with routine gynecological exam - pap 2021 -  MMG guidelines reviewed - release for vaccines discussed  2. Screening examination for STD (sexually transmitted disease) - Cervicovaginal ancillary only( [place])  3. Encounter for other general counseling or advice on contraception - she will return with cycle onset for Nexplanon insertion  4.  Vulvar infection - Bactrim DS BID x 7 days to pharmacy.  Pt will give update.  5.  Nicotine use (e cigarettes)  6.  Adopted

## 2021-04-07 DIAGNOSIS — Z0282 Encounter for adoption services: Secondary | ICD-10-CM | POA: Insufficient documentation

## 2021-04-07 DIAGNOSIS — Z720 Tobacco use: Secondary | ICD-10-CM | POA: Insufficient documentation

## 2021-04-07 LAB — CERVICOVAGINAL ANCILLARY ONLY
Chlamydia: NEGATIVE
Comment: NEGATIVE
Comment: NORMAL
Neisseria Gonorrhea: NEGATIVE

## 2021-04-09 ENCOUNTER — Encounter (HOSPITAL_BASED_OUTPATIENT_CLINIC_OR_DEPARTMENT_OTHER): Payer: Self-pay

## 2021-04-09 ENCOUNTER — Other Ambulatory Visit: Payer: Self-pay

## 2021-04-09 ENCOUNTER — Telehealth (HOSPITAL_BASED_OUTPATIENT_CLINIC_OR_DEPARTMENT_OTHER): Payer: Self-pay | Admitting: Obstetrics & Gynecology

## 2021-04-09 ENCOUNTER — Ambulatory Visit (INDEPENDENT_AMBULATORY_CARE_PROVIDER_SITE_OTHER): Payer: 59 | Admitting: Obstetrics & Gynecology

## 2021-04-09 ENCOUNTER — Encounter (HOSPITAL_BASED_OUTPATIENT_CLINIC_OR_DEPARTMENT_OTHER): Payer: Self-pay | Admitting: Obstetrics & Gynecology

## 2021-04-09 VITALS — BP 118/78 | HR 72 | Resp 14

## 2021-04-09 DIAGNOSIS — Z3009 Encounter for other general counseling and advice on contraception: Secondary | ICD-10-CM

## 2021-04-09 DIAGNOSIS — Z30017 Encounter for initial prescription of implantable subdermal contraceptive: Secondary | ICD-10-CM | POA: Diagnosis not present

## 2021-04-09 NOTE — Telephone Encounter (Signed)
Patient is coming in today at 11:15 am to be seen.

## 2021-04-09 NOTE — Progress Notes (Signed)
20 y.o. G0P0000 Caucasian Single female presents for Nexplanon insertion.  She has been counseled about alternative types of contraception and has decided this long acting method is the best for her.  Procedure, risks and benefits have all been explained.  She has the following questions today:  none.     LMP:  04/09/2021   After all questions were answered, consent was obtained.    No past medical history on file.  Past Surgical History:  Procedure Laterality Date  . BREAST REDUCTION SURGERY  02/20/2020   Dr. Benna Dunks  . TONSILLECTOMY  2018  . WISDOM TOOTH EXTRACTION  2018    Current Outpatient Medications on File Prior to Visit  Medication Sig Dispense Refill  . sulfamethoxazole-trimethoprim (BACTRIM DS) 800-160 MG tablet Take 1 tablet by mouth 2 (two) times daily. 14 tablet 0   No current facility-administered medications on file prior to visit.   No Known Allergies  Vitals:   04/09/21 1124  BP: 118/78  Pulse: 72  Resp: 14   Physical Exam Constitutional:      Appearance: Normal appearance.  Skin:    General: Skin is warm and dry.  Neurological:     General: No focal deficit present.     Mental Status: She is alert.  Psychiatric:        Mood and Affect: Mood normal.     Procedure: Patient placed supine on exam table with her right arm flexed at the elbow and externally rotated. The location for insertion site was identified on the inner side of lower arm.  Area cleansed with Betadine x 3 and draped in normal sterile fashion.  Insertion site and path of insertion was anesthetized with 1% Lidocaine without epinephrine, 3cc total.  Usine Nexplanon device (and after confirming present of rod in device), skin was pierced and then elevated along insertion path, passing device just under the skin.  Rod released and device removed.  Rod palpated easily.  Small amount of bleeding made hemostatic with pressure.  Steri-strips applied and a pressure bandage was place over the site.   Entire procedure performed with sterile technique.  Lot:  [id number]  Expiration: 04/25/2023  Assessment: Nexplanon insertion.  Plan:  Post procedure instructions reviewed with pt.  Questions answered.  Pt knows to call with any concerns or questions.  Pt is aware removal is due by 3 calendar years from today.

## 2021-06-04 ENCOUNTER — Encounter (HOSPITAL_BASED_OUTPATIENT_CLINIC_OR_DEPARTMENT_OTHER): Payer: Self-pay | Admitting: Obstetrics & Gynecology

## 2021-09-22 ENCOUNTER — Encounter (HOSPITAL_BASED_OUTPATIENT_CLINIC_OR_DEPARTMENT_OTHER): Payer: Self-pay

## 2021-10-07 ENCOUNTER — Encounter (HOSPITAL_BASED_OUTPATIENT_CLINIC_OR_DEPARTMENT_OTHER): Payer: Self-pay | Admitting: Obstetrics & Gynecology

## 2021-10-07 ENCOUNTER — Other Ambulatory Visit: Payer: Self-pay

## 2021-10-07 ENCOUNTER — Ambulatory Visit (HOSPITAL_BASED_OUTPATIENT_CLINIC_OR_DEPARTMENT_OTHER): Payer: 59 | Admitting: Obstetrics & Gynecology

## 2021-10-07 VITALS — BP 121/83 | HR 47 | Ht 66.0 in | Wt 201.2 lb

## 2021-10-07 DIAGNOSIS — Z3046 Encounter for surveillance of implantable subdermal contraceptive: Secondary | ICD-10-CM | POA: Diagnosis not present

## 2021-10-07 DIAGNOSIS — N926 Irregular menstruation, unspecified: Secondary | ICD-10-CM

## 2021-10-07 NOTE — Patient Instructions (Signed)
Please remove dressing tomorrow.  Leave steri-strips on for at least 10 days.  Can remove when they are getting loose. Keep clean and dry.  Use soap and water to clean.  Call with any signs of infection (redness, increased tenderness, drainage) or any new concerns.

## 2021-10-07 NOTE — Progress Notes (Signed)
20 y.o. G0P0000 Caucasian Single female presents for Nexplanon removal.  She has been experiencing irregular bleeding.  She had this placed in April, 2022.  She has decided that she wants to "hormone free" for the time being.  She is not SA at this time.  We did discuss non-hormonal options for contraception.  She declines any at time this.   After all questions were answered, consent was obtained.    No past medical history on file.  Past Surgical History:  Procedure Laterality Date   BREAST REDUCTION SURGERY  02/20/2020   Dr. Benna Dunks   TONSILLECTOMY  2018   WISDOM TOOTH EXTRACTION  2018    No current outpatient medications on file prior to visit.   No current facility-administered medications on file prior to visit.   No Known Allergies  Vitals:   10/07/21 1425  BP: 121/83  Pulse: (!) 47   Physical Exam Constitutional:      Appearance: Normal appearance.  Neurological:     General: No focal deficit present.     Mental Status: She is alert.  Psychiatric:        Mood and Affect: Mood normal.    Procedure: Patient placed supine on exam table with her left arm flexed at the elbow and externally rotated. The prior insertion site was located and the Nexplanon rod was palpated.  Area cleansed with Betadine x 3 and draped in normal sterile fashion.  Insertion site and surrounding tissue anesthetized with 1% Lidocaine without epinephrine, 1.0cc total used.  Small incision made with #11 blade.  Capsule around rod was dissected with #11 blade, releasing the Nexplanon rod.  This was then removed without difficulty using hemostat.  Small amount of bleeding was noted and made hemostatic with pressure.  Steri-strips were applied and pressure dressing placed over the site.  Entire procedure performed with sterile technique.  Pt tolerated procedure well.  Assessment/Plan: 1. Nexplanon removal - advised pt to call if changes mind about using any future contraception - post procedure  instructions given  2. Irregular bleeding - pt will monitor to ensure this normalizes after Nexplanon removal

## 2021-12-31 ENCOUNTER — Other Ambulatory Visit: Payer: Self-pay

## 2021-12-31 ENCOUNTER — Encounter (HOSPITAL_BASED_OUTPATIENT_CLINIC_OR_DEPARTMENT_OTHER): Payer: Self-pay | Admitting: Urology

## 2021-12-31 ENCOUNTER — Emergency Department (HOSPITAL_BASED_OUTPATIENT_CLINIC_OR_DEPARTMENT_OTHER)
Admission: EM | Admit: 2021-12-31 | Discharge: 2021-12-31 | Disposition: A | Discharge status: Home / Self Care | Payer: 59 | Attending: Emergency Medicine | Admitting: Emergency Medicine

## 2021-12-31 ENCOUNTER — Emergency Department (HOSPITAL_BASED_OUTPATIENT_CLINIC_OR_DEPARTMENT_OTHER): Payer: 59

## 2021-12-31 DIAGNOSIS — M79604 Pain in right leg: Secondary | ICD-10-CM | POA: Diagnosis not present

## 2021-12-31 DIAGNOSIS — S7011XA Contusion of right thigh, initial encounter: Secondary | ICD-10-CM | POA: Diagnosis not present

## 2021-12-31 DIAGNOSIS — W5512XA Struck by horse, initial encounter: Secondary | ICD-10-CM | POA: Diagnosis not present

## 2021-12-31 DIAGNOSIS — S79921A Unspecified injury of right thigh, initial encounter: Secondary | ICD-10-CM | POA: Diagnosis present

## 2021-12-31 NOTE — ED Provider Notes (Signed)
[facility] EMERGENCY DEPARTMENT Provider Note   CSN: [id number] Arrival date & time: 12/31/21  2149     History Chief Complaint  Patient presents with   Leg Pain    Krystal Simmons is a 21 y.o. female who presents the emergency department with right leg pain has been ongoing the last month.  Patient states that she was kicked by horse back in December and had subsequent hematoma to the right medial thigh at that is just been getting larger.  She was seen evaluated urgent care who instructed her to come to the emergency department to rule out blood clot.  She has no chest pain, no dyspnea on exertion, no significant leg swelling, no fever, chills. She has not been taking any medications or home remedies.    Leg Pain     Home Medications Prior to Admission medications   Not on File      Allergies    Patient has no known allergies.    Review of Systems   Review of Systems  All other systems reviewed and are negative.  Physical Exam Updated Vital Signs BP (!) 148/91 (BP Location: Left Arm)    Pulse 65    Temp 98.7 F (37.1 C) (Oral)    Resp 18    Ht 5\' 6"  (1.676 m)    Wt 91.3 kg    LMP 12/10/2021 (Approximate)    SpO2 95%    BMI 32.49 kg/m  Physical Exam Vitals and nursing note reviewed.  Constitutional:      Appearance: Normal appearance.  HENT:     Head: Normocephalic and atraumatic.  Eyes:     General:        Right eye: No discharge.        Left eye: No discharge.     Conjunctiva/sclera: Conjunctivae normal.  Cardiovascular:     Pulses:          Dorsalis pedis pulses are 2+ on the right side and 2+ on the left side.  Pulmonary:     Effort: Pulmonary effort is normal.  Skin:    General: Skin is warm and dry.     Findings: No rash.     Comments: There is slight ecchymosis to the right medial thigh with some firmness over the right medial thigh.  Area does not appear to be warm to palpation.  Area is nontender.   Neurological:     General: No focal  deficit present.     Mental Status: She is alert.  Psychiatric:        Mood and Affect: Mood normal.        Behavior: Behavior normal.    ED Results / Procedures / Treatments   Labs (all labs ordered are listed, but only abnormal results are displayed) Labs Reviewed - No data to display  EKG None  Radiology 12/12/2021 Venous Img Lower Unilateral Right  Result Date: 12/31/2021 CLINICAL DATA:  Right leg pain and swelling, initial encounter EXAM: RIGHT LOWER EXTREMITY VENOUS DOPPLER ULTRASOUND TECHNIQUE: Gray-scale sonography with graded compression, as well as color Doppler and duplex ultrasound were performed to evaluate the lower extremity deep venous systems from the level of the common femoral vein and including the common femoral, femoral, profunda femoral, popliteal and calf veins including the posterior tibial, peroneal and gastrocnemius veins when visible. The superficial great saphenous vein was also interrogated. Spectral Doppler was utilized to evaluate flow at rest and with distal augmentation maneuvers in the common femoral, femoral and  popliteal veins. COMPARISON:  None. FINDINGS: Contralateral Common Femoral Vein: Respiratory phasicity is normal and symmetric with the symptomatic side. No evidence of thrombus. Normal compressibility. Common Femoral Vein: No evidence of thrombus. Normal compressibility, respiratory phasicity and response to augmentation. Saphenofemoral Junction: No evidence of thrombus. Normal compressibility and flow on color Doppler imaging. Profunda Femoral Vein: No evidence of thrombus. Normal compressibility and flow on color Doppler imaging. Femoral Vein: No evidence of thrombus. Normal compressibility, respiratory phasicity and response to augmentation. Popliteal Vein: No evidence of thrombus. Normal compressibility, respiratory phasicity and response to augmentation. Calf Veins: No evidence of thrombus. Normal compressibility and flow on color Doppler imaging.  Superficial Great Saphenous Vein: No evidence of thrombus. Normal compressibility. Venous Reflux:  None. Other Findings: Focal fluid collection in the upper thigh in the area of focal pain. This measures 5.3 x 0.8 x 6.2 cm and likely represents a resolving hematoma. IMPRESSION: No evidence of deep venous thrombosis. Findings suggestive of a resolving hematoma in the thigh. Electronically Signed   By: Alcide Clever M.D.   On: 12/31/2021 23:00    Procedures Procedures    Medications Ordered in ED Medications - No data to display  ED Course/ Medical Decision Making/ A&P                           Medical Decision Making Amount and/or Complexity of Data Reviewed ECG/medicine tests: ordered.   Krystal Simmons is a 21 y.o. female with no significant comorbidities to impact her care who presents the emergency department with right leg pain.  There is concern for possible DVT considering the mechanism of injury.  Overall, the leg appears grossly normal.  Does not appear dusky or discolored.  There is no significant swelling.  She has no chest pain or shortness of breath or other vital signs to make me think of possible right pulmonary embolism at this time.  Compartments are soft and have a low suspicion of compartment syndrome at this time.  I personally reviewed the ultrasound images of the right lower leg.  There is not appear to be any blood clot that shows some hematoma in the right upper thigh.  I do agree with the radiologist of rotation.  I discussed over-the-counter remedies for the patient.  This includes heat, exercise and movement, she may also take ibuprofen which will help with pain and dissolving of the clot.  Overall, I do believe she can likely benefit from outpatient follow-up.  Strict turn precautions given.  She is safe for discharge.  She has no significant social determinants of health to impact her care moving forward.   Final Clinical Impression(s) / ED Diagnoses Final  diagnoses:  Right leg pain    Rx / DC Orders ED Discharge Orders     None         Jolyn Lent 12/31/21 2308    Charlynne Pander, MD 01/07/22 [phone]

## 2021-12-31 NOTE — Discharge Instructions (Addendum)
Please continue to use warm compresses and I would encourage you to walk as this can help break up the clot.  Please return to the emergency department for worsening symptoms.  You may also use ibuprofen for pain.

## 2021-12-31 NOTE — ED Triage Notes (Signed)
Right upper thigh hematoma from December (kicked by horse) Was healing but now hot to the touch Seen at [facility], told to come to r/o blood clot Right leg numbness and burning sensation

## 2021-12-31 NOTE — ED Notes (Signed)
Discharge instructions discussed with pt. Pt verbalized understanding. Pt stable and ambulatory.  °

## 2022-06-29 ENCOUNTER — Encounter (HOSPITAL_BASED_OUTPATIENT_CLINIC_OR_DEPARTMENT_OTHER): Payer: Self-pay

## 2022-06-29 ENCOUNTER — Other Ambulatory Visit: Payer: Self-pay

## 2022-06-29 ENCOUNTER — Emergency Department (HOSPITAL_BASED_OUTPATIENT_CLINIC_OR_DEPARTMENT_OTHER)
Admission: EM | Admit: 2022-06-29 | Discharge: 2022-06-29 | Disposition: A | Discharge status: Home / Self Care | Payer: 59 | Attending: Emergency Medicine | Admitting: Emergency Medicine

## 2022-06-29 DIAGNOSIS — R110 Nausea: Secondary | ICD-10-CM | POA: Insufficient documentation

## 2022-06-29 DIAGNOSIS — R109 Unspecified abdominal pain: Secondary | ICD-10-CM | POA: Diagnosis not present

## 2022-06-29 DIAGNOSIS — K625 Hemorrhage of anus and rectum: Secondary | ICD-10-CM | POA: Diagnosis present

## 2022-06-29 DIAGNOSIS — K922 Gastrointestinal hemorrhage, unspecified: Secondary | ICD-10-CM

## 2022-06-29 DIAGNOSIS — R1084 Generalized abdominal pain: Secondary | ICD-10-CM | POA: Insufficient documentation

## 2022-06-29 DIAGNOSIS — R630 Anorexia: Secondary | ICD-10-CM | POA: Insufficient documentation

## 2022-06-29 LAB — COMPREHENSIVE METABOLIC PANEL
ALT: 20 U/L (ref 0–44)
AST: 20 U/L (ref 15–41)
Albumin: 4.6 g/dL (ref 3.5–5.0)
Alkaline Phosphatase: 59 U/L (ref 38–126)
Anion gap: 8 (ref 5–15)
BUN: 9 mg/dL (ref 6–20)
CO2: 24 mmol/L (ref 22–32)
Calcium: 9.8 mg/dL (ref 8.9–10.3)
Chloride: 107 mmol/L (ref 98–111)
Creatinine, Ser: 0.79 mg/dL (ref 0.44–1.00)
GFR, Estimated: >60 mL/min (ref >60)
Glucose, Bld: 92 mg/dL (ref 70–99)
Potassium: 4.2 mmol/L (ref 3.5–5.1)
Sodium: 139 mmol/L (ref 135–145)
Total Bilirubin: 0.9 mg/dL (ref 0.3–1.2)
Total Protein: 8.5 g/dL — ABNORMAL HIGH (ref 6.5–8.1)

## 2022-06-29 LAB — URINALYSIS, MICROSCOPIC (REFLEX): WBC, UA: NONE SEEN WBC/hpf (ref 0–5)

## 2022-06-29 LAB — URINALYSIS, ROUTINE W REFLEX MICROSCOPIC
Bilirubin Urine: NEGATIVE
Glucose, UA: NEGATIVE mg/dL
Ketones, ur: NEGATIVE mg/dL
Leukocytes,Ua: NEGATIVE
Nitrite: NEGATIVE
Protein, ur: NEGATIVE mg/dL
Specific Gravity, Urine: 1.02 (ref 1.005–1.030)
pH: 8 (ref 5.0–8.0)

## 2022-06-29 LAB — CBC
HCT: 45.2 % (ref 36.0–46.0)
Hemoglobin: 15 g/dL (ref 12.0–15.0)
MCH: 29.6 pg (ref 26.0–34.0)
MCHC: 33.2 g/dL (ref 30.0–36.0)
MCV: 89.3 fL (ref 80.0–100.0)
Platelets: 356 10*3/uL (ref 150–400)
RBC: 5.06 MIL/uL (ref 3.87–5.11)
RDW: 14.4 % (ref 11.5–15.5)
WBC: 11.4 10*3/uL — ABNORMAL HIGH (ref 4.0–10.5)
nRBC: 0 % (ref 0.0–0.2)

## 2022-06-29 LAB — LIPASE, BLOOD: Lipase: 53 U/L — ABNORMAL HIGH (ref 11–51)

## 2022-06-29 LAB — PREGNANCY, URINE: Preg Test, Ur: NEGATIVE

## 2022-06-29 NOTE — ED Notes (Signed)
POC occult +. Dr. Wallace Cullens aware.

## 2022-06-29 NOTE — ED Provider Notes (Signed)
[facility] EMERGENCY DEPARTMENT Provider Note   CSN: [id number] Arrival date & time: 06/29/22  1723     History  Chief Complaint  Patient presents with   Rectal Bleeding    Krystal Simmons is a 21 y.o. female.  Pt is a 21 yo female presenting for rectal bleeding. Pt admits to bright red blood per rectum with and without bowel movements x 2 weeks. Reports soft-liquid bowel movements. Admits to decreased appetite with nausea and generalized abdominal pain x 1 week. Denies prior hx of GI bleed. Denies hx of hemorrhoids. Denies anal sex.   The history is provided by the patient. No language interpreter was used.  Rectal Bleeding Associated symptoms: abdominal pain   Associated symptoms: no fever and no vomiting        Home Medications Prior to Admission medications   Not on File      Allergies    Patient has no known allergies.    Review of Systems   Review of Systems  Constitutional:  Negative for chills and fever.  HENT:  Negative for ear pain and sore throat.   Eyes:  Negative for pain and visual disturbance.  Respiratory:  Negative for cough and shortness of breath.   Cardiovascular:  Negative for chest pain and palpitations.  Gastrointestinal:  Positive for abdominal pain, anal bleeding, hematochezia and nausea. Negative for vomiting.  Genitourinary:  Negative for dysuria and hematuria.  Musculoskeletal:  Negative for arthralgias and back pain.  Skin:  Negative for color change and rash.  Neurological:  Negative for seizures and syncope.  All other systems reviewed and are negative.   Physical Exam Updated Vital Signs BP (!) 148/103 (BP Location: Left Arm)   Pulse 88   Temp 99.3 F (37.4 C) (Oral)   Resp 18   Ht 5' 5.5" (1.664 m)   Wt 95.3 kg   LMP 06/08/2022 (Approximate)   SpO2 99%   BMI 34.41 kg/m  Physical Exam Vitals and nursing note reviewed. Exam conducted with a chaperone present.  Constitutional:      General: She is not in acute  distress.    Appearance: She is well-developed.  HENT:     Head: Normocephalic and atraumatic.  Eyes:     Conjunctiva/sclera: Conjunctivae normal.  Cardiovascular:     Rate and Rhythm: Normal rate and regular rhythm.     Heart sounds: No murmur heard. Pulmonary:     Effort: Pulmonary effort is normal. No respiratory distress.     Breath sounds: Normal breath sounds.  Abdominal:     Palpations: Abdomen is soft.     Tenderness: There is no abdominal tenderness.  Genitourinary:    Rectum: Normal. Guaiac result positive. No mass, tenderness, anal fissure, external hemorrhoid or internal hemorrhoid. Normal anal tone.  Musculoskeletal:        General: No swelling.     Cervical back: Neck supple.  Skin:    General: Skin is warm and dry.     Capillary Refill: Capillary refill takes less than 2 seconds.  Neurological:     Mental Status: She is alert.  Psychiatric:        Mood and Affect: Mood normal.     ED Results / Procedures / Treatments   Labs (all labs ordered are listed, but only abnormal results are displayed) Labs Reviewed  LIPASE, BLOOD - Abnormal; Notable for the following components:      Result Value   Lipase 53 (*)    All other  components within normal limits  COMPREHENSIVE METABOLIC PANEL - Abnormal; Notable for the following components:   Total Protein 8.5 (*)    All other components within normal limits  CBC - Abnormal; Notable for the following components:   WBC 11.4 (*)    All other components within normal limits  URINALYSIS, ROUTINE W REFLEX MICROSCOPIC - Abnormal; Notable for the following components:   Hgb urine dipstick SMALL (*)    All other components within normal limits  URINALYSIS, MICROSCOPIC (REFLEX) - Abnormal; Notable for the following components:   Bacteria, UA RARE (*)    All other components within normal limits  PREGNANCY, URINE  OCCULT BLOOD X 1 CARD TO LAB, STOOL    EKG None  Radiology No results found.  Procedures Procedures     Medications Ordered in ED Medications - No data to display  ED Course/ Medical Decision Making/ A&P                           Medical Decision Making Amount and/or Complexity of Data Reviewed Labs: ordered.   6:42 PM 21 yo female presenting for rectal bleeding and bright red blood in stool.  Patient is alert and oriented x3, no acute distress, afebrile, stable vital signs.  Physical exam demonstrates no fissures, no external or internal hemorrhoids.  No gross blood on rectal exam.  No tenderness.  Guaiac positive for microscopic blood.  Hemoglobin 15.  Abdomen otherwise soft and nontender.  Patient recommended for close follow-up with GI specialist for rectal bleeding.  Patient in no distress and overall condition improved here in the ED. Detailed discussions were had with the patient regarding current findings, and need for close f/u with PCP or on call doctor. The patient has been instructed to return immediately if the symptoms worsen in any way for re-evaluation. Patient verbalized understanding and is in agreement with current care plan. All questions answered prior to discharge.         Final Clinical Impression(s) / ED Diagnoses Final diagnoses:  Rectal bleeding  Gastrointestinal hemorrhage, unspecified gastrointestinal hemorrhage type    Rx / DC Orders ED Discharge Orders     None         Franne Forts, DO 06/29/22 1842

## 2022-06-29 NOTE — ED Triage Notes (Signed)
Rectal bleeding x 1 week, small amounts originally but now worsening. Nausea after eating. C/o abdominal pain.

## 2022-06-29 NOTE — ED Notes (Signed)
Dc instructions reviewed with pt no question or concerns at this time.

## 2022-08-26 ENCOUNTER — Ambulatory Visit (INDEPENDENT_AMBULATORY_CARE_PROVIDER_SITE_OTHER): Payer: 59 | Admitting: Obstetrics & Gynecology

## 2022-08-26 ENCOUNTER — Other Ambulatory Visit (HOSPITAL_COMMUNITY)
Admission: RE | Admit: 2022-08-26 | Discharge: 2022-08-26 | Disposition: A | Discharge status: Home / Self Care | Payer: 59 | Source: Ambulatory Visit | Attending: Obstetrics & Gynecology | Admitting: Obstetrics & Gynecology

## 2022-08-26 ENCOUNTER — Encounter (HOSPITAL_BASED_OUTPATIENT_CLINIC_OR_DEPARTMENT_OTHER): Payer: Self-pay | Admitting: Obstetrics & Gynecology

## 2022-08-26 VITALS — BP 118/84 | HR 49 | Ht 65.0 in | Wt 216.8 lb

## 2022-08-26 DIAGNOSIS — Z01419 Encounter for gynecological examination (general) (routine) without abnormal findings: Secondary | ICD-10-CM

## 2022-08-26 DIAGNOSIS — Z0000 Encounter for general adult medical examination without abnormal findings: Secondary | ICD-10-CM | POA: Diagnosis not present

## 2022-08-26 DIAGNOSIS — Z124 Encounter for screening for malignant neoplasm of cervix: Secondary | ICD-10-CM | POA: Insufficient documentation

## 2022-08-26 DIAGNOSIS — Z113 Encounter for screening for infections with a predominantly sexual mode of transmission: Secondary | ICD-10-CM

## 2022-08-26 DIAGNOSIS — Z30011 Encounter for initial prescription of contraceptive pills: Secondary | ICD-10-CM | POA: Diagnosis not present

## 2022-08-26 DIAGNOSIS — F17200 Nicotine dependence, unspecified, uncomplicated: Secondary | ICD-10-CM

## 2022-08-26 DIAGNOSIS — L732 Hidradenitis suppurativa: Secondary | ICD-10-CM

## 2022-08-26 MED ORDER — NORETHINDRONE 0.35 MG PO TABS: 1.0000 | ORAL_TABLET | Freq: Every day | ORAL | 3 refills | Status: DC

## 2022-08-26 MED ORDER — CLINDAMYCIN PHOSPHATE 1 % EX SOLN: Freq: Two times a day (BID) | CUTANEOUS | 3 refills | Status: DC

## 2022-08-26 MED ORDER — DOXYCYCLINE HYCLATE 100 MG PO CAPS: 100.0000 mg | ORAL_CAPSULE | Freq: Two times a day (BID) | ORAL | 0 refills | Status: DC

## 2022-08-26 NOTE — Patient Instructions (Signed)
Lendon Ka, [facility].  Colorectal Surgery.

## 2022-08-26 NOTE — Progress Notes (Signed)
21 y.o. G0P0000 [name] here for annual exam.  Had rectal bleeding in the summer.  She did have an ER visit with heme positive stool.  She had a colonoscopy and a rectal polyp.  Saw Dr. Marca Ancona.  She will need a follow up colonoscopy in 5 years.    Cycles are regular.  Does want to start on contraception.  Had Nexplanon removed.  Not interested in IUD.   Having increased issues with hydradenitis.  When was on doxycycline, it was better.  Had hypotension with spirolactone so off this now.  Patient's last menstrual period was 07/28/2022 (approximate).          Sexually active: Yes.    The current method of family planning is condoms.    Smoker:  yes  Health Maintenance: Pap:  03/20/2020 Negative History of abnormal Pap:  no MMG:  guidelines reviewed Colonoscopy:  follow up 5 years Screening Labs: will do hba1c today   reports that she has been smoking e-cigarettes. She has never used smokeless tobacco. She reports current alcohol use of about 1.0 standard drink of alcohol per week. She reports that she does not use drugs.  No past medical history on file.  Past Surgical History:  Procedure Laterality Date   BREAST REDUCTION SURGERY  02/20/2020   Dr. Benna Dunks   TONSILLECTOMY  2018   WISDOM TOOTH EXTRACTION  2018    Current Outpatient Medications  Medication Sig Dispense Refill   norethindrone (MICRONOR) 0.35 MG tablet Take 1 tablet (0.35 mg total) by mouth daily. 28 tablet 3   No current facility-administered medications for this visit.    No family history on file.  ROS: Constitutional: negative Genitourinary:negative  Exam:   BP 118/84 (BP Location: Left Arm, Patient Position: Sitting, Cuff Size: Large)   Pulse (!) 49   Ht 5\' 5"  (1.651 m) Comment: reported  Wt 216 lb 12.8 oz (98.3 kg)   LMP 07/28/2022 (Approximate)   BMI 36.08 kg/m   Height: 5\' 5"  (165.1 cm) (reported)  General appearance: alert, cooperative and appears stated age Head:  Normocephalic, without obvious abnormality, atraumatic Neck: no adenopathy, supple, symmetrical, trachea midline and thyroid normal to inspection and palpation Lungs: clear to auscultation bilaterally Breasts: normal appearance, no masses or tenderness Heart: regular rate and rhythm Abdomen: soft, non-tender; bowel sounds normal; no masses,  no organomegaly Extremities: extremities normal, atraumatic, no cyanosis or edema Skin: Skin color, texture, turgor normal. No rashes or lesions Lymph nodes: Cervical, supraclavicular, and axillary nodes normal. No abnormal inguinal nodes palpated Neurologic: Grossly normal   Pelvic: External genitalia:  no lesions              Urethra:  normal appearing urethra with no masses, tenderness or lesions              Bartholins and Skenes: normal                 Vagina: normal appearing vagina with normal color and no discharge, no lesions              Cervix: no lesions              Pap taken: Yes.   Bimanual Exam:  Uterus:  normal size, contour, position, consistency, mobility, non-tender              Adnexa: normal adnexa and no mass, fullness, tenderness               Rectovaginal:  Confirms               Anus:  normal sphincter tone, no lesions  Chaperone, Ina Homes, CMA, was present for exam.  Assessment/Plan: 1. Well woman exam with routine gynecological exam - Pap smear obtained - Mammogram guidelines reviewed - Colonoscopy guidelines reviewed - lab work ordered - vaccines reviewed/updated  2. Blood tests for routine general physical examination - Hemoglobin A1c  3. Encounter for initial prescription of contraceptive pills - norethindrone (MICRONOR) 0.35 MG tablet; Take 1 tablet (0.35 mg total) by mouth daily.  Dispense: 28 tablet; Refill: 3  4. Screen for STD (sexually transmitted disease) - GC/Chl obtained with pap smear  5. Smoker - encouraged to quit  6. Cervical cancer screening - Cytology - PAP( )  7.  Hydradenitis - doxycycline (VIBRAMYCIN) 100 MG capsule; Take 1 capsule (100 mg total) by mouth 2 (two) times daily. Take with food as can cause GI distress.  Dispense: 20 capsule; Refill: 0 - WOUND CULTURE

## 2022-08-27 LAB — HEMOGLOBIN A1C
Est. average glucose Bld gHb Est-mCnc: 105 mg/dL
Hgb A1c MFr Bld: 5.3 % (ref 4.8–5.6)

## 2022-08-29 DIAGNOSIS — L732 Hidradenitis suppurativa: Secondary | ICD-10-CM | POA: Insufficient documentation

## 2022-08-29 DIAGNOSIS — F17200 Nicotine dependence, unspecified, uncomplicated: Secondary | ICD-10-CM | POA: Insufficient documentation

## 2022-08-30 LAB — WOUND CULTURE

## 2022-08-31 LAB — CYTOLOGY - PAP
Adequacy: ABSENT
Chlamydia: NEGATIVE
Comment: NEGATIVE
Comment: NORMAL
Diagnosis: NEGATIVE
Neisseria Gonorrhea: NEGATIVE

## 2022-09-29 ENCOUNTER — Ambulatory Visit: Payer: 59 | Admitting: Cardiology

## 2022-09-29 ENCOUNTER — Encounter: Payer: Self-pay | Admitting: Cardiology

## 2022-09-29 ENCOUNTER — Other Ambulatory Visit: Payer: 59

## 2022-09-29 VITALS — BP 133/77 | HR 46 | Temp 98.0°F | Resp 16 | Ht 65.0 in | Wt 218.0 lb

## 2022-09-29 DIAGNOSIS — R001 Bradycardia, unspecified: Secondary | ICD-10-CM

## 2022-09-29 DIAGNOSIS — I491 Atrial premature depolarization: Secondary | ICD-10-CM

## 2022-09-29 NOTE — Progress Notes (Signed)
ID:  Krystal Simmons, DOB 26-Aug-2001, MRN [id number]  PCP:  Patient, No Pcp Per  Cardiologist:  Tessa Lerner, DO, [facility] (established care 09/29/2022)  REASON FOR CONSULT: Fatigue/bradycardia  REQUESTING PHYSICIAN:  Salley Scarlet, MD [address] [place],  [place] [address]  Chief Complaint  Patient presents with   Bradycardia   Fatigue   New Patient (Initial Visit)    Referred by Milinda Antis, MD    HPI  Krystal Simmons is a 21 y.o. Caucasian female who presents to the clinic for evaluation of tiredness / low heart rate at the request of Jeanice Lim, Velna Hatchet, MD. Her past medical history and cardiovascular risk factors include: ADHD, Anemia, Insomnia, Vaping (nicotine products), obesity due to excess calories.   Patient is accompanied by her mother at today's office visit. Patient is here for evaluation of feeling tired/fatigued/low heart rate.  Patient states that recently she is been at doctors offices and urgent cares and heart rate has been documented to be in the 40s.  Going up she used to play tennis all the way up to college.  She stopped playing 1 year ago without any solidifying reasons.  Patient denies any near-syncope or syncope.  But does at times feel tired and fatigue.   Review of systems also positive for chest pain.  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  Substernally located, heaviness like sensation, no worsening factors, improved with walking around/moving, lasting for few minutes, self-limited.  She describes the chest heaviness as " cannot breathe."  Drinks 1-2 beers / day on weekends. Last marijuana use couple months ago. Last use of cocaine 3years ago.  Patient denies a consumption of excessive coffee, no stimulants, no weight loss supplements, no herbal supplements. She has energy drinks 2 or 3 times a week at most.  At work she consumes a significant amount of soda (all the time) up to 15 to 20 cups per shift  (ginger ale, Coke, lemonade).  Patient is adopted and does not know any family history of premature CAD/sudden cardiac death/cardiomyopathy.  FUNCTIONAL STATUS: She works as a Child psychotherapist on average getting 5000-10000 steps per day.  When she works double shifts she is walking up to 25,000 steps a day.  Otherwise no structured exercise program or daily routine.  ALLERGIES: No Known Allergies  MEDICATION LIST PRIOR TO VISIT: Current Meds  Medication Sig   norethindrone (MICRONOR) 0.35 MG tablet Take 1 tablet (0.35 mg total) by mouth daily.   Vitamin D, Ergocalciferol, (DRISDOL) 1.25 MG (50000 UNIT) CAPS capsule Take 50,000 Units by mouth once a week.     PAST MEDICAL HISTORY: History reviewed. No pertinent past medical history.  PAST SURGICAL HISTORY: Past Surgical History:  Procedure Laterality Date   BREAST REDUCTION SURGERY  02/20/2020   Dr. Benna Dunks   TONSILLECTOMY  2018   WISDOM TOOTH EXTRACTION  2018    FAMILY HISTORY: The patient family history is not on file. She was adopted.  SOCIAL HISTORY:  The patient  reports that she has been smoking e-cigarettes. She has never used smokeless tobacco. She reports current alcohol use of about 2.0 standard drinks of alcohol per week. She reports that she does not currently use drugs after having used the following drugs: Cocaine and Marijuana.  REVIEW OF SYSTEMS: Review of Systems  Constitutional: Positive for malaise/fatigue.  Cardiovascular:  Negative for chest pain, claudication, dyspnea on exertion, irregular heartbeat, leg swelling, near-syncope, orthopnea, palpitations, paroxysmal nocturnal  dyspnea and syncope.  Respiratory:  Negative for shortness of breath.   Hematologic/Lymphatic: Negative for bleeding problem.  Musculoskeletal:  Negative for muscle cramps and myalgias.  Neurological:  Negative for dizziness and light-headedness.    PHYSICAL EXAM:    09/29/2022    9:08 AM 08/26/2022    2:37 PM 06/29/2022    6:48 PM   Vitals with BMI  Height 5\' 5"  5\' 5"    Weight 218 lbs 216 lbs 13 oz   BMI 36.28 36.08   Systolic 133 118  Diastolic 77 84 90  Pulse 46 49 80    Physical Exam  Constitutional: No distress.  Age appropriate, hemodynamically stable.   Neck: No JVD present.  Cardiovascular: Normal rate, regular rhythm, S1 normal, S2 normal, intact distal pulses and normal pulses. Exam reveals no gallop, no S3 and no S4.  No murmur heard. Pulses:      Dorsalis pedis pulses are 2+ on the right side and 2+ on the left side.       Posterior tibial pulses are 2+ on the right side and 2+ on the left side.  Pulmonary/Chest: Effort normal and breath sounds normal. No stridor. She has no wheezes. She has no rales.  Abdominal: Soft. Bowel sounds are normal. She exhibits no distension. There is no abdominal tenderness.  Musculoskeletal:        General: No edema.     Cervical back: Neck supple.  Neurological: She is alert and oriented to person, place, and time. She has intact cranial nerves (2-12).  Skin: Skin is warm and moist.   CARDIAC DATABASE: EKG: 09/29/2022: Sinus bradycardia 52 bpm, occasional PACs.  Echocardiogram: No results found for this or any previous visit from the past 1095 days.   Stress Testing: No results found for this or any previous visit from the past 1095 days.  Heart Catheterization: None  LABORATORY DATA:    Latest Ref Rng & Units 06/29/2022    5:36 PM 12/02/2020    1:40 AM 03/25/2017    6:00 AM  CBC  WBC 4.0 - 10.5 K/uL 11.4  9.5  14.7   Hemoglobin 12.0 - 15.0 g/dL 12/04/2020  05/25/2017  83.3   Hematocrit 36.0 - 46.0 % 45.2  40.9  36.5   Platelets 150 - 400 K/uL 356  313  325        Latest Ref Rng & Units 06/29/2022    5:36 PM 12/02/2020    1:40 AM 03/25/2017    6:00 AM  CMP  Glucose 70 - 99 mg/dL 92  12/04/2020  05/25/2017   BUN 6 - 20 mg/dL 9  13  12    Creatinine 0.44 - 1.00 mg/dL 976  734    Sodium 135 - 145 mmol/L 139  134  134   Potassium 3.5 - 5.1 mmol/L 4.2  3.6  4.0    Chloride 98 - 111 mmol/L 107  101  102   CO2 22 - 32 mmol/L 24  22  22    Calcium 8.9 - 10.3 mg/dL 9.8  9.4  9.4   Total Protein 6.5 - 8.1 g/dL 8.5     Total Bilirubin 0.3 - 1.2 mg/dL 0.9     Alkaline Phos 38 - 126 U/L 59     AST 15 - 41 U/L 20     ALT 0 - 44 U/L 20       Lipid Panel  No results found for: "CHOL", "TRIG", "HDL", "CHOLHDL", "VLDL", "LDLCALC", "LDLDIRECT", "LABVLDL"  No components found for: "NTPROBNP" No results for input(s): "PROBNP" in the last 8760 hours. No results for input(s): "TSH" in the last 8760 hours.  BMP Recent Labs    06/29/22 1736  NA 139  K 4.2  CL 107  CO2 24  GLUCOSE 92  BUN 9  CREATININE 0.79  CALCIUM 9.8  GFRNONAA >60    HEMOGLOBIN A1C Lab Results  Component Value Date   HGBA1C 5.3 08/26/2022   External Labs: Collected: 09/08/2022 provided by referring physician. TSH 6.56. Hemoglobin 13.5, hematocrit 42%. BUN 9, creatinine 0.67. Sodium 138, potassium 5, chloride 105, bicarb 25. AST 17, ALT 19, alkaline phosphatase 68 Total cholesterol 150, triglycerides 87, HDL 55, LDL 79, non-HDL 96    IMPRESSION:    ICD-10-CM   1. Bradycardia  R00.1 EKG 12-Lead    PCV ECHOCARDIOGRAM COMPLETE    LONG TERM MONITOR (3-14 DAYS)    2. Premature atrial contraction  I49.1 PCV ECHOCARDIOGRAM COMPLETE    PCV CARDIAC STRESS TEST       RECOMMENDATIONS: Bailynn Dyk is a 21 y.o. Caucasian female whose past medical history and cardiac risk factors include: ADHD, Anemia, Insomnia, Vaping (nicotine products), obesity due to excess calories.   Patient was referred to the practice for evaluation of bradycardia/tired/fatigue.    I suspect that the bradycardia noted during her vital signs may be due to the fact that her underlying ectopic beats are not accounted.  EKG notes sinus mechanism with PACs.  Other reversible causes of her bradycardia could be underlying hypothyroidism (TSH levels are elevated), excessive consumption of caffeinated/sugar  content beverages.  3-day extended Holter monitor to evaluate for underlying rhythm and dysrhythmias.  Exercise treadmill stress test to evaluate for chronotropic competence, exercise-induced arrhythmias/ischemia.  Overall probability of underlying ischemia is low given her young age and risk factors.  Echo will be ordered to evaluate for structural heart disease and left ventricular systolic function.  Reemphasized the importance of decreasing the consumption of energy drinks, caffeinated and high sugar content drinks, following up with PCP to see if hypothyroidism may be contributory.   FINAL MEDICATION LIST END OF ENCOUNTER: No orders of the defined types were placed in this encounter.   Medications Discontinued During This Encounter  Medication Reason   clindamycin (CLEOCIN T) 1 % external solution    doxycycline (VIBRAMYCIN) 100 MG capsule      Current Outpatient Medications:    norethindrone (MICRONOR) 0.35 MG tablet, Take 1 tablet (0.35 mg total) by mouth daily., Disp: 28 tablet, Rfl: 3   Vitamin D, Ergocalciferol, (DRISDOL) 1.25 MG (50000 UNIT) CAPS capsule, Take 50,000 Units by mouth once a week., Disp: , Rfl:   Orders Placed This Encounter  Procedures   PCV CARDIAC STRESS TEST   LONG TERM MONITOR (3-14 DAYS)   EKG 12-Lead   PCV ECHOCARDIOGRAM COMPLETE    There are no Patient Instructions on file for this visit.   --Continue cardiac medications as reconciled in final medication list. --Return in about 7 weeks (around 11/17/2022) for Reevaluation of, Palpitations, Review test results. or sooner if needed. --Continue follow-up with your primary care physician regarding the management of your other chronic comorbid conditions.  Patient's questions and concerns were addressed to her satisfaction. She voices understanding of the instructions provided during this encounter.   This note was created using a voice recognition software as a result there may be grammatical errors  inadvertently enclosed that do not reflect the nature of this encounter. Every attempt is made  to correct such errors.  Rex Kras, [place], [facility]  Pager: [phone] Office: [phone]

## 2022-10-16 NOTE — Progress Notes (Signed)
Called and spoke with patient regarding her cardiac monitor results. Patient advised that all once all tests are complete, provider will go over all results at her scheduled appointment on 11/14/22.

## 2022-10-23 ENCOUNTER — Ambulatory Visit: Payer: 59

## 2022-10-23 DIAGNOSIS — I491 Atrial premature depolarization: Secondary | ICD-10-CM

## 2022-10-23 DIAGNOSIS — R001 Bradycardia, unspecified: Secondary | ICD-10-CM

## 2022-11-09 NOTE — Progress Notes (Signed)
Called patient to inform her about her stress test. Patient understood

## 2022-11-17 ENCOUNTER — Encounter: Payer: Self-pay | Admitting: Cardiology

## 2022-11-17 ENCOUNTER — Ambulatory Visit: Payer: 59 | Admitting: Cardiology

## 2022-11-17 VITALS — BP 123/85 | HR 55 | Ht 65.0 in | Wt 210.2 lb

## 2022-11-17 DIAGNOSIS — I491 Atrial premature depolarization: Secondary | ICD-10-CM

## 2022-11-17 DIAGNOSIS — R002 Palpitations: Secondary | ICD-10-CM

## 2022-11-17 DIAGNOSIS — R001 Bradycardia, unspecified: Secondary | ICD-10-CM

## 2022-11-17 DIAGNOSIS — R072 Precordial pain: Secondary | ICD-10-CM

## 2022-11-17 DIAGNOSIS — E6609 Other obesity due to excess calories: Secondary | ICD-10-CM

## 2022-11-17 NOTE — Progress Notes (Signed)
ID:  Krystal Simmons, DOB 2001/12/13, MRN [id number]  PCP:  Patient, No Pcp Per  Cardiologist:  Rex Kras, DO, [facility] (established care 09/29/2022)  Date: 11/17/22 Last Office Visit: 09/29/2022  Chief Complaint  Patient presents with   Follow-up    Reevaluation of chest pain and bradycardia   Results    HPI  Krystal Simmons is a 21 y.o. Caucasian female whose  past medical history and cardiovascular risk factors include: ADHD, Anemia, Insomnia, Vaping (nicotine products), obesity due to excess calories.   Patient was referred to the practice for evaluation and management of feeling tired/fatigued/Bradycardia.  In addition her review of symptoms were also positive for precordial discomfort (likely noncardiac).   Since last office visit she underwent  echocardiogram, and exercise treadmill stress test, cardiac monitor.  Results reviewed with her in great detail and noted below for further reference.  Clinically patient states that her chest pain has essentially resolved.  She is no longer consuming energy drinks, coffee, and has only had 2 soda since last visit.  She was started on Ozempic for weight loss and has lost 8 pounds since last office visit patient states that she feels more energetic.   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  When she works double shifts she is walking up to 25,000 steps a day.  Otherwise no structured exercise program or daily routine.  ALLERGIES: No Known Allergies  MEDICATION LIST PRIOR TO VISIT: Current Meds  Medication Sig   norethindrone (MICRONOR) 0.35 MG tablet Take 1 tablet (0.35 mg total) by mouth daily.   Semaglutide (OZEMPIC, 0.25 OR 0.5 MG/DOSE, [place] ) Inject 0.5 mg into the skin once a week.   Vitamin D, Ergocalciferol, (DRISDOL) 1.25 MG (50000 UNIT) CAPS capsule Take 50,000 Units by mouth once a week.     PAST MEDICAL HISTORY: History reviewed. No pertinent past medical history.  PAST  SURGICAL HISTORY: Past Surgical History:  Procedure Laterality Date   BREAST REDUCTION SURGERY  02/20/2020   Dr. Stephanie Coup   TONSILLECTOMY  2018   WISDOM TOOTH EXTRACTION  2018    FAMILY HISTORY: The patient family history is not on file. She was adopted.  SOCIAL HISTORY:  The patient  reports that she has been smoking e-cigarettes. She has never used smokeless tobacco. She reports current alcohol use of about 2.0 standard drinks of alcohol per week. She reports that she does not currently use drugs after having used the following drugs: Cocaine and Marijuana.  REVIEW OF SYSTEMS: Review of Systems  Cardiovascular:  Negative for chest pain, claudication, dyspnea on exertion, irregular heartbeat, leg swelling, near-syncope, orthopnea, palpitations, paroxysmal nocturnal dyspnea and syncope.  Respiratory:  Negative for shortness of breath.   Hematologic/Lymphatic: Negative for bleeding problem.  Musculoskeletal:  Negative for muscle cramps and myalgias.  Neurological:  Negative for dizziness and light-headedness.    PHYSICAL EXAM:    11/17/2022    2:21 PM 09/29/2022    9:08 AM 08/26/2022    2:37 PM  Vitals with BMI  Height 5\' 5"  5\' 5"  5\' 5"   Weight 210 lbs 3 oz 218 lbs 216 lbs 13 oz  BMI 34.98 [id number]  Systolic [id number]  Diastolic 85 77 84  Pulse 55 46 49    Physical Exam  Constitutional: No distress.  Age appropriate, hemodynamically stable.   Neck: No JVD present.  Cardiovascular: Regular rhythm, S1 normal, S2 normal, intact distal pulses and normal pulses. Bradycardia present. Exam  reveals no gallop, no S3 and no S4.  No murmur heard. Pulses:      Dorsalis pedis pulses are 2+ on the right side and 2+ on the left side.       Posterior tibial pulses are 2+ on the right side and 2+ on the left side.  Pulmonary/Chest: Effort normal and breath sounds normal. No stridor. She has no wheezes. She has no rales.  Abdominal: Soft. Bowel sounds are normal. She exhibits no  distension. There is no abdominal tenderness.  Musculoskeletal:        General: No edema.     Cervical back: Neck supple.  Neurological: She is alert and oriented to person, place, and time. She has intact cranial nerves (2-12).  Skin: Skin is warm and moist.   CARDIAC DATABASE: EKG: 11/17/2022: Sinus bradycardia, 55 bpm, without underlying ischemia or injury pattern.  Echocardiogram: 10/23/2022:  Normal LV systolic function with visual EF 60-65%. Left ventricle cavity is normal in size. Normal left ventricular wall thickness. Normal global wall motion. Normal diastolic filling pattern, normal LAP. Calculated EF 66%. Structurally normal tricuspid valve with trace regurgitation. No evidence of pulmonary hypertension. Trace pericardial effusion. No prior available for comparison.    Stress Testing: Exercise treadmill stress test 10/23/2022: Exercise treadmill stress test performed using Bruce protocol. Patient reached 10.1 METS, and 87% of age predicted maximum heart rate. Exercise capacity was good. 4/10 non-limiting chest pain reported. Normal heart rate and hemodynamic response. Stress EKG revealed no ischemic changes. No sinus bradycardia noted.  Low risk study.   Heart Catheterization: None  Cardiac monitor (Zio Patch): 09/29/2022-10/01/2022 Dominant rhythm sinus, followed by bradycardia (burden 39%). Heart rate 37-188 bpm. Avg HR 66 bpm. No atrial fibrillation, supraventricular tachycardia, ventricular tachycardia, high grade AV block, pauses (3 seconds or longer). Total ventricular ectopic burden <1%. Total supraventricular ectopic burden <1%. Patient triggered events: 6. Underlying rhythm either sinus or sinus tachycardia without dysrhythmia.   LABORATORY DATA:    Latest Ref Rng & Units 06/29/2022    5:36 PM 12/02/2020    1:40 AM 03/25/2017    6:00 AM  CBC  WBC 4.0 - 10.5 K/uL 11.4  9.5  14.7   Hemoglobin 12.0 - 15.0 g/dL 15.0  13.0  12.1   Hematocrit 36.0 - 46.0 % 45.2   40.9  36.5   Platelets 150 - 400 K/uL 356  313  325        Latest Ref Rng & Units 06/29/2022    5:36 PM 12/02/2020    1:40 AM 03/25/2017    6:00 AM  CMP  Glucose 70 - 99 mg/dL 92  107  112   BUN 6 - 20 mg/dL 9  13  12    Creatinine 0.44 - 1.00 mg/dL 0.79  0.88  0.85   Sodium 135 - 145 mmol/L 139  134  134   Potassium 3.5 - 5.1 mmol/L 4.2  3.6  4.0   Chloride 98 - 111 mmol/L 107  101  102   CO2 22 - 32 mmol/L 24  22  22    Calcium 8.9 - 10.3 mg/dL 9.8  9.4  9.4   Total Protein 6.5 - 8.1 g/dL 8.5     Total Bilirubin 0.3 - 1.2 mg/dL 0.9     Alkaline Phos 38 - 126 U/L 59     AST 15 - 41 U/L 20     ALT 0 - 44 U/L 20       Lipid Panel  No  results found for: "CHOL", "TRIG", "HDL", "CHOLHDL", "VLDL", "LDLCALC", "LDLDIRECT", "LABVLDL"  No components found for: "NTPROBNP" No results for input(s): "PROBNP" in the last 8760 hours. No results for input(s): "TSH" in the last 8760 hours.  BMP Recent Labs    06/29/22 1736  NA 139  K 4.2  CL 107  CO2 24  GLUCOSE 92  BUN 9  CREATININE 0.79  CALCIUM 9.8  GFRNONAA >60    HEMOGLOBIN A1C Lab Results  Component Value Date   HGBA1C 5.3 08/26/2022   External Labs: Collected: 09/08/2022 provided by referring physician. TSH 6.56. Hemoglobin 13.5, hematocrit 42%. BUN 9, creatinine 0.67. Sodium 138, potassium 5, chloride 105, bicarb 25. AST 17, ALT 19, alkaline phosphatase 68 Total cholesterol 150, triglycerides 87, HDL 55, LDL 79, non-HDL 96  IMPRESSION:    ICD-10-CM   1. Palpitations  R00.2 EKG 12-Lead    2. Bradycardia  R00.1     3. Premature atrial contraction  I49.1     4. Precordial pain  R07.2     5. Class 1 obesity due to excess calories without serious comorbidity with body mass index (BMI) of 34.0 to 34.9 in adult  E66.09    Z68.34        RECOMMENDATIONS: Shondra Capps is a 21 y.o. Caucasian female whose past medical history and cardiac risk factors include: ADHD, Anemia, Insomnia, Vaping (nicotine products),  obesity due to excess calories.   Palpitations / Bradycardia Asymptomatic. Good chronotropic competence illustrated on recent GXT. Cardiac monitor notes an average heart rate of 66 bpm. Patient plans to follow-up with PCP for management of hypothyroidism.  Premature atrial contraction Asymptomatic. Has significantly reduced consumption of soda. No longer consumes energy drinks or coffee.  Precordial pain: Resolved. Echocardiogram notes preserved LVEF without any significant valvular heart disease.  An exercise treadmill stress test notes acceptable functional capacity for age, no exercise-induced ischemia.  FINAL MEDICATION LIST END OF ENCOUNTER: No orders of the defined types were placed in this encounter.   There are no discontinued medications.    Current Outpatient Medications:    norethindrone (MICRONOR) 0.35 MG tablet, Take 1 tablet (0.35 mg total) by mouth daily., Disp: 28 tablet, Rfl: 3   Semaglutide (OZEMPIC, 0.25 OR 0.5 MG/DOSE, [place]), Inject 0.5 mg into the skin once a week., Disp: , Rfl:    Vitamin D, Ergocalciferol, (DRISDOL) 1.25 MG (50000 UNIT) CAPS capsule, Take 50,000 Units by mouth once a week., Disp: , Rfl:   Orders Placed This Encounter  Procedures   EKG 12-Lead    There are no Patient Instructions on file for this visit.   --Continue cardiac medications as reconciled in final medication list. --Return if symptoms worsen or fail to improve. or sooner if needed. --Continue follow-up with your primary care physician regarding the management of your other chronic comorbid conditions.  Patient's questions and concerns were addressed to her satisfaction. She voices understanding of the instructions provided during this encounter.   This note was created using a voice recognition software as a result there may be grammatical errors inadvertently enclosed that do not reflect the nature of this encounter. Every attempt is made to correct such errors.  Tessa Lerner,  [place], [facility]  Pager: [phone] Office: [phone]

## 2022-12-12 ENCOUNTER — Other Ambulatory Visit (HOSPITAL_BASED_OUTPATIENT_CLINIC_OR_DEPARTMENT_OTHER): Payer: Self-pay | Admitting: Obstetrics & Gynecology

## 2022-12-12 DIAGNOSIS — Z30011 Encounter for initial prescription of contraceptive pills: Secondary | ICD-10-CM

## 2023-05-17 IMAGING — US US EXTREM LOW VENOUS*R*
1 series · 13 of 24 positions shown · non-contrast
Comparison: None.

CLINICAL DATA: Right leg pain and swelling, initial encounter



[Series 1: us extrem low venous*right* · 38 acquisitions, 13 frames shown]
[im 1/38]
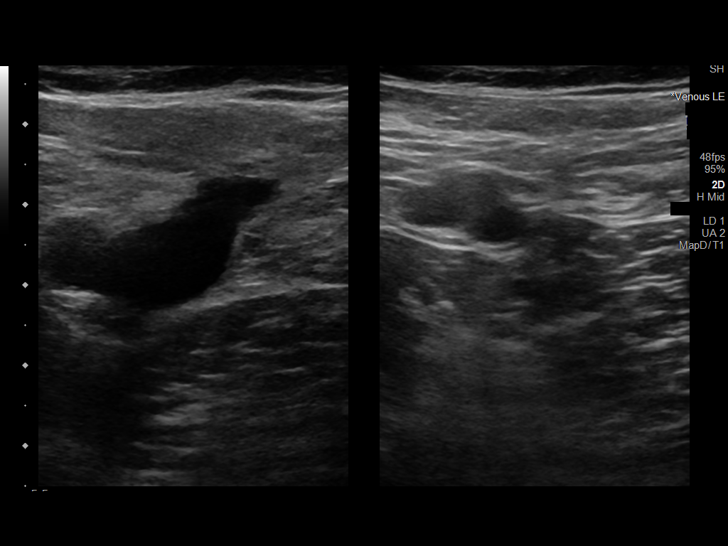
[im 4/38]
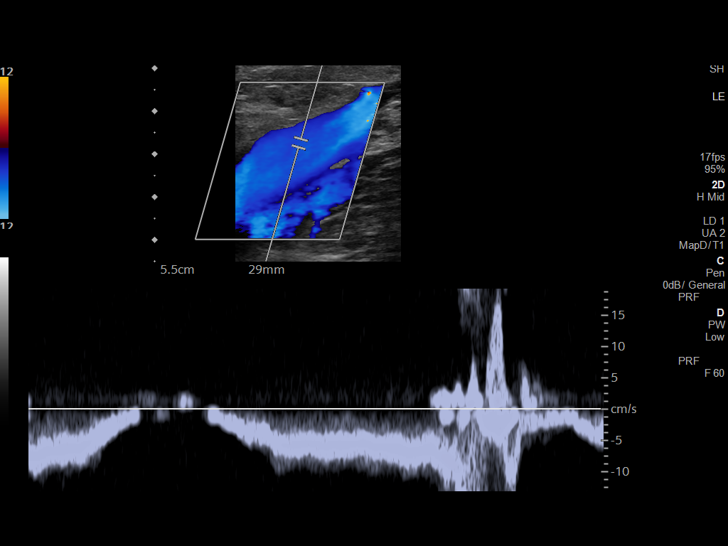
[im 7/38]
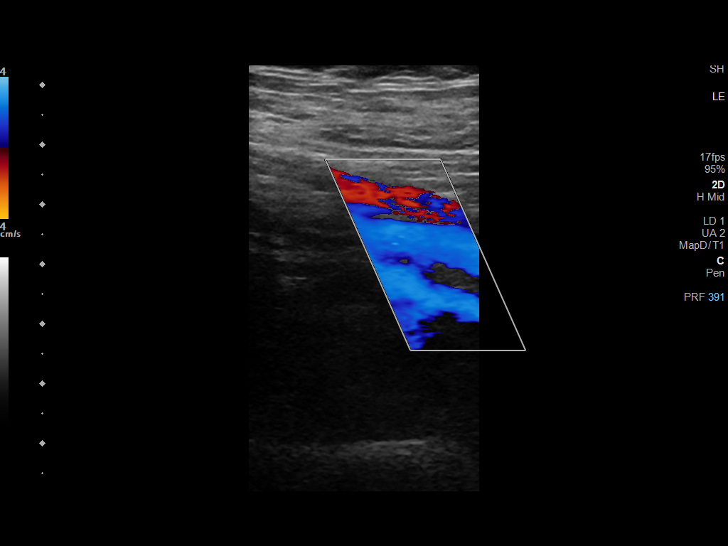
[im 12/38]
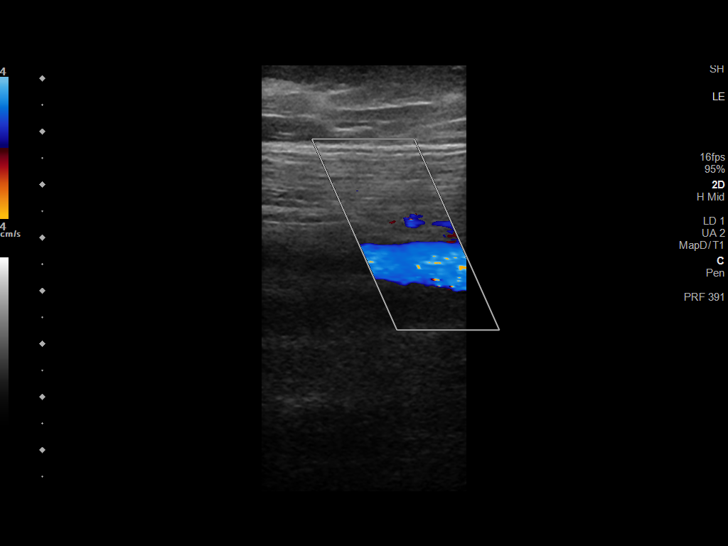
[im 15/38]
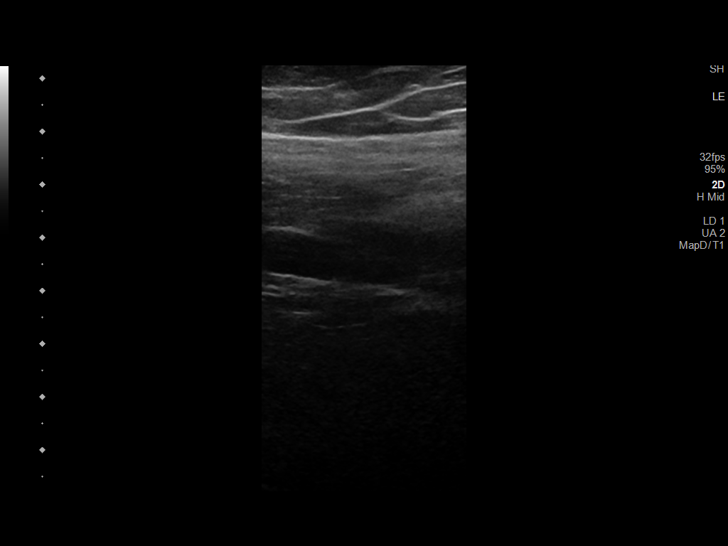
[im 18/38]
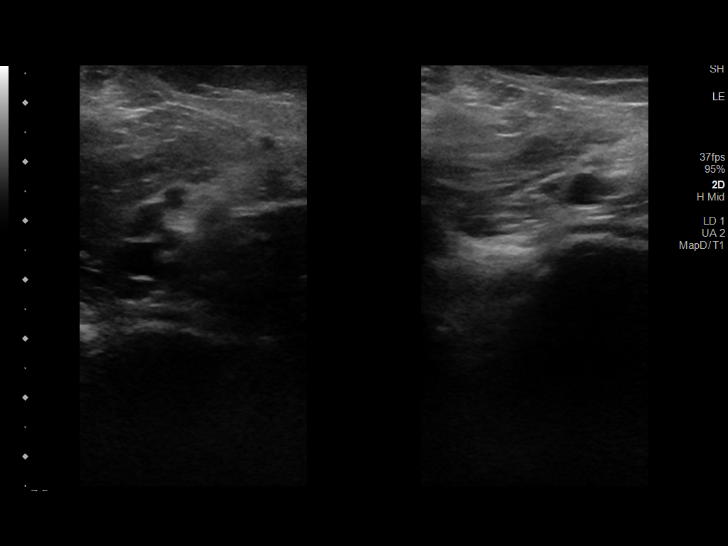
[im 21/38]
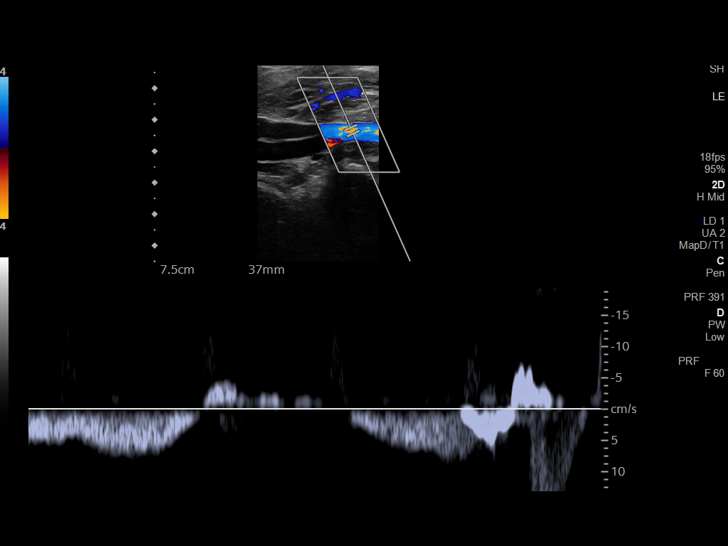
[im 23/38]
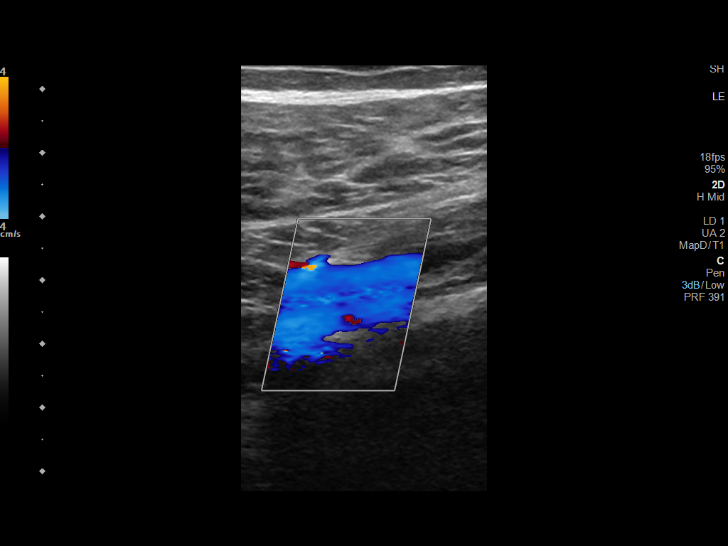
[im 25/38]
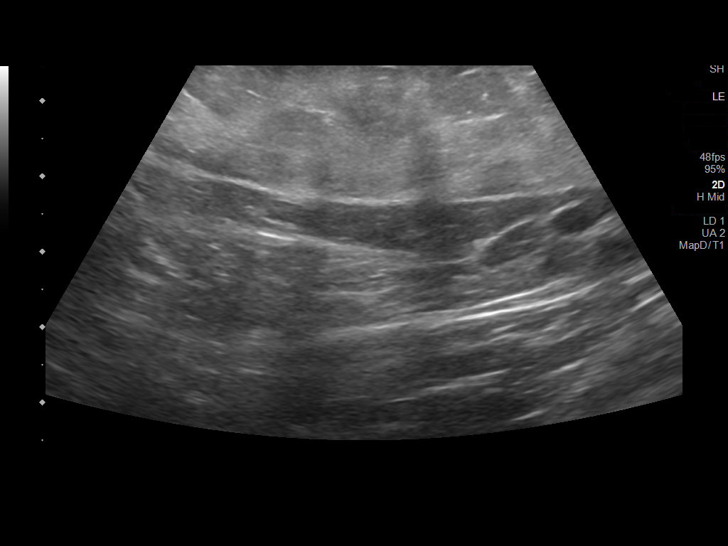
[im 26/38]
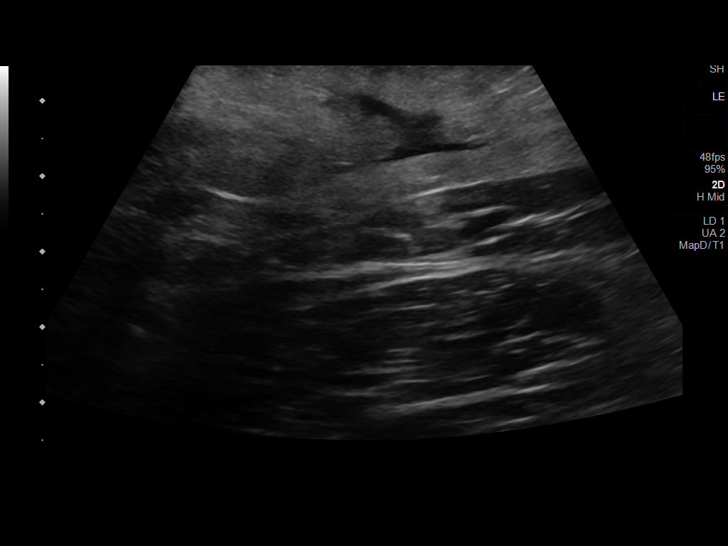
[im 31/38]
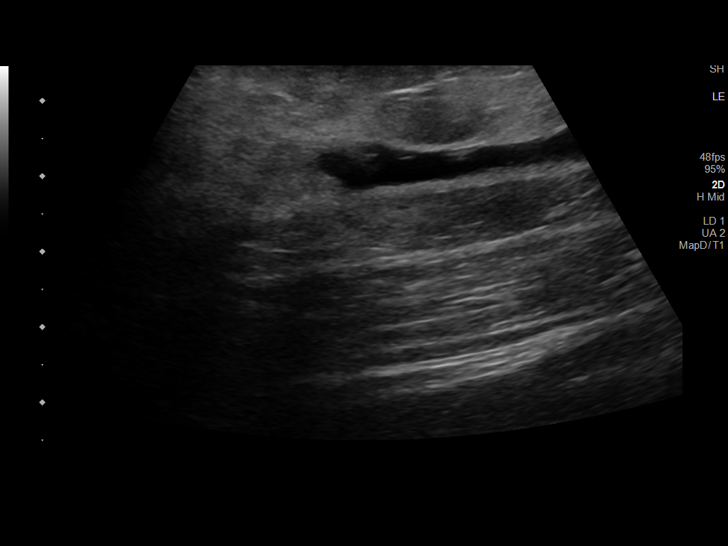
[im 34/38]
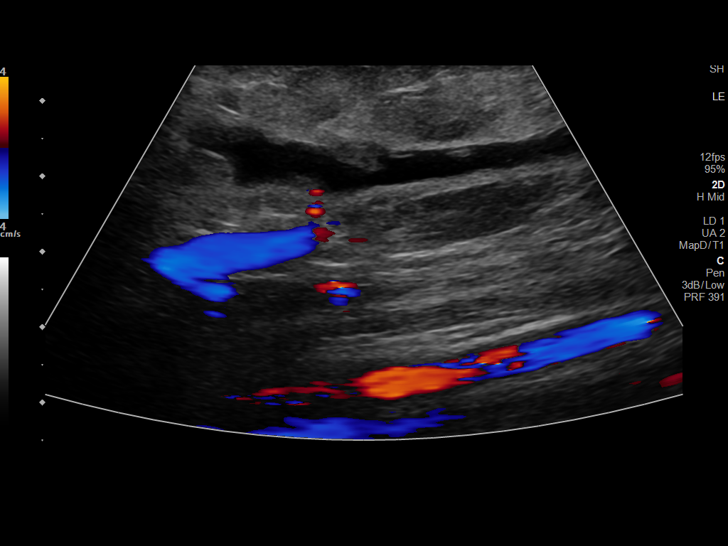
[im 38/38]
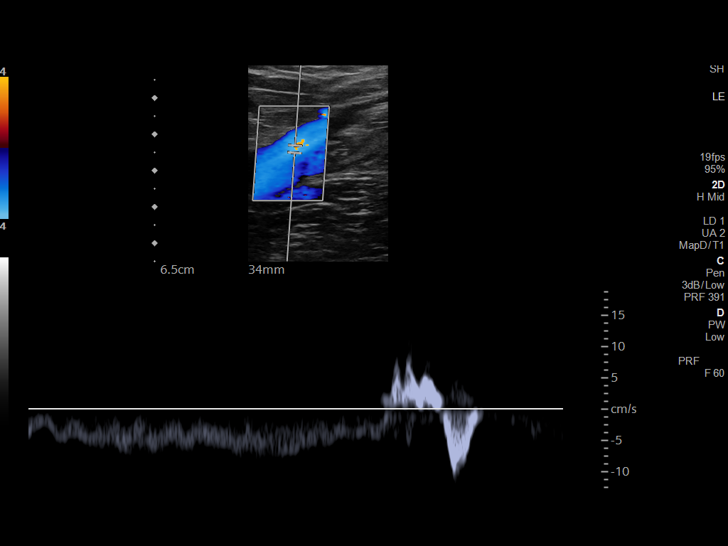

[13 of 24 positions shown; findings below may reference images not displayed]

FINDINGS: Contralateral Common Femoral Vein: Respiratory phasicity is normal
and symmetric with the symptomatic side. No evidence of thrombus.
Normal compressibility.

Common Femoral Vein: No evidence of thrombus. Normal
compressibility, respiratory phasicity and response to augmentation.

Saphenofemoral Junction: No evidence of thrombus. Normal
compressibility and flow on color Doppler imaging.

Profunda Femoral Vein: No evidence of thrombus. Normal
compressibility and flow on color Doppler imaging.

Femoral Vein: No evidence of thrombus. Normal compressibility,
respiratory phasicity and response to augmentation.

Popliteal Vein: No evidence of thrombus. Normal compressibility,
respiratory phasicity and response to augmentation.

Calf Veins: No evidence of thrombus. Normal compressibility and flow
on color Doppler imaging.

Superficial Great Saphenous Vein: No evidence of thrombus. Normal
compressibility.

Venous Reflux:  None.

Other Findings: Focal fluid collection in the upper thigh in the
area of focal pain. This measures 5.3 x 0.8 x 6.2 cm and likely
represents a resolving hematoma.
IMPRESSION: No evidence of deep venous thrombosis.

Findings suggestive of a resolving hematoma in the thigh.

## 2023-06-01 ENCOUNTER — Other Ambulatory Visit (HOSPITAL_BASED_OUTPATIENT_CLINIC_OR_DEPARTMENT_OTHER): Payer: Self-pay | Admitting: Obstetrics & Gynecology

## 2023-06-01 DIAGNOSIS — Z30011 Encounter for initial prescription of contraceptive pills: Secondary | ICD-10-CM

## 2023-06-03 ENCOUNTER — Other Ambulatory Visit (HOSPITAL_BASED_OUTPATIENT_CLINIC_OR_DEPARTMENT_OTHER): Payer: Self-pay

## 2023-06-03 ENCOUNTER — Encounter (HOSPITAL_BASED_OUTPATIENT_CLINIC_OR_DEPARTMENT_OTHER): Payer: Self-pay | Admitting: Obstetrics & Gynecology

## 2023-09-18 ENCOUNTER — Other Ambulatory Visit (HOSPITAL_BASED_OUTPATIENT_CLINIC_OR_DEPARTMENT_OTHER): Payer: Self-pay | Admitting: Obstetrics & Gynecology

## 2023-09-18 DIAGNOSIS — Z30011 Encounter for initial prescription of contraceptive pills: Secondary | ICD-10-CM

## 2023-09-21 NOTE — Telephone Encounter (Signed)
LMOVM for pt to call regarding refill request (needs appt)

## 2023-09-27 ENCOUNTER — Ambulatory Visit (HOSPITAL_BASED_OUTPATIENT_CLINIC_OR_DEPARTMENT_OTHER): Payer: 59 | Admitting: Certified Nurse Midwife

## 2023-09-27 ENCOUNTER — Encounter (HOSPITAL_BASED_OUTPATIENT_CLINIC_OR_DEPARTMENT_OTHER): Payer: Self-pay | Admitting: Certified Nurse Midwife

## 2023-09-27 VITALS — BP 111/70 | HR 50 | Ht 65.0 in | Wt 177.8 lb

## 2023-09-27 DIAGNOSIS — Z01419 Encounter for gynecological examination (general) (routine) without abnormal findings: Secondary | ICD-10-CM | POA: Diagnosis not present

## 2023-09-27 DIAGNOSIS — Z30011 Encounter for initial prescription of contraceptive pills: Secondary | ICD-10-CM

## 2023-09-27 MED ORDER — NORETHINDRONE 0.35 MG PO TABS: 1.0000 | ORAL_TABLET | Freq: Every day | ORAL | 4 refills | Status: DC

## 2023-09-27 NOTE — Progress Notes (Signed)
  Hx Colonoscopy 2023, +rectal polyp, needs follow-up colonoscopy 2028.  COC started 08/2022 Hidradenitis (had hypotension with spironolactone)  Pap 08/26/22 Negative   22 y.o. G0P0000 [name] here for annual exam. Pt declines breast and pelvic exam. Declines physical exam. She reports she feels well physically and emotionally. She has lost at least 43lb with semaglutide, healthy diet and exercise. She has no concern for STI and no desire for testing. Very satisfied with her birth control pills and would like to continue these. Pap smear UTD 08/2022 (Negative). Pt with no complaints today.   No LMP recorded.          The current method of family planning is oral progesterone-only contraceptive.     The pregnancy intention screening data noted above was reviewed. Potential methods of contraception were discussed. The patient elected to proceed with No data recorded.  Exercising: Yes.     Smoker:  no  Health Maintenance: Pap:  UTD 08/2022 Negative, next pap due 08/2025 History of abnormal Pap:  no   reports that she has been smoking e-cigarettes. She has never used smokeless tobacco. She reports current alcohol use of about 2.0 standard drinks of alcohol per week. She reports that she does not currently use drugs after having used the following drugs: Cocaine and Marijuana.  No past medical history on file.  Past Surgical History:  Procedure Laterality Date   BREAST REDUCTION SURGERY  02/20/2020   Dr. Benna Dunks   TONSILLECTOMY  2018   WISDOM TOOTH EXTRACTION  2018    Current Outpatient Medications  Medication Sig Dispense Refill   Semaglutide (OZEMPIC, 0.25 OR 0.5 MG/DOSE, [place]) Inject 0.5 mg into the skin once a week.     norethindrone (MICRONOR) 0.35 MG tablet Take 1 tablet (0.35 mg total) by mouth daily. 90 tablet 4   Vitamin D, Ergocalciferol, (DRISDOL) 1.25 MG (50000 UNIT) CAPS capsule Take 50,000 Units by mouth once a week. (Patient not taking: Reported on  09/27/2023)     No current facility-administered medications for this visit.    Family History  Adopted: Yes    ROS: Constitutional: negative Genitourinary:negative  Exam:   BP 111/70 (BP Location: Right Arm, Patient Position: Sitting, Cuff Size: Normal)   Pulse (!) 50   Ht 5\' 5"  (1.651 m)   Wt 177 lb 12.8 oz (80.6 kg)   BMI 29.59 kg/m   Height: 5\' 5"  (165.1 cm)  General appearance: alert, cooperative and appears stated age Head: Normocephalic, Lungs: normal respiratory effort Breasts: pt declines breast exam today Heart: regular rate and rhythm Abdomen: soft, non-tender; bowel sounds normal; no masses,  no organomegaly Extremities: extremities normal, atraumatic, no cyanosis or edema Skin: Skin color, texture, turgor normal. No rashes or lesions Neurologic: Grossly normal  Pelvic: Pt declined pelvic exam and bimanual today  Assessment/Plan:  1. Encounter for initial prescription of contraceptive pills -Pt would like to continue Micronor - norethindrone (MICRONOR) 0.35 MG tablet; Take 1 tablet (0.35 mg total) by mouth daily.  Dispense: 90 tablet; Refill: 4  2. Gynecologic exam normal -She declined physical/pelvic/breast exam today. -Declined Flu vaccine but plans Flu vaccine elsewhere. -Depression screen negative  RTO one year for annual gyn exam and prn if issues arise. [name]

## 2023-10-21 ENCOUNTER — Ambulatory Visit (HOSPITAL_BASED_OUTPATIENT_CLINIC_OR_DEPARTMENT_OTHER): Payer: 59 | Admitting: Certified Nurse Midwife

## 2023-10-21 ENCOUNTER — Encounter (HOSPITAL_BASED_OUTPATIENT_CLINIC_OR_DEPARTMENT_OTHER): Payer: Self-pay | Admitting: Certified Nurse Midwife

## 2023-10-21 ENCOUNTER — Encounter (HOSPITAL_BASED_OUTPATIENT_CLINIC_OR_DEPARTMENT_OTHER): Payer: Self-pay | Admitting: Obstetrics & Gynecology

## 2023-10-21 VITALS — BP 119/80 | HR 51 | Ht 65.0 in | Wt 173.2 lb

## 2023-10-21 DIAGNOSIS — K625 Hemorrhage of anus and rectum: Secondary | ICD-10-CM | POA: Diagnosis not present

## 2023-10-21 NOTE — Telephone Encounter (Signed)
Called pt in response to [employer]. Pt reports increased rectal bleeding this morning. Has history of rectal polyp. Pt provided with appt for evaluation.

## 2023-10-21 NOTE — Progress Notes (Signed)
  Rectal-no bleeding   Subjective:     Krystal Simmons is a 22 y.o. female with hx rectal polyp. Pt states that in 2023 she had rectal bleeding, colonoscopy, rectal polyp was removed. She was told at that time that she had an internal hemorrhoid. Last night she passed a bowel movement (partial stool) and then noticed some bright red bleeding. This has continued to occur this morning. She passes some stool and then notices bright red bleeding.   The following portions of the patient's history were reviewed and updated as appropriate: allergies, current medications, past family history, past medical history, past social history, past surgical history, and problem list.   Review of Systems Pertinent items are noted in HPI.    Objective:    BP 119/80 (BP Location: Left Arm, Patient Position: Sitting, Cuff Size: Normal)   Pulse (!) 51   Ht 5\' 5"  (1.651 m)   Wt 173 lb 3.2 oz (78.6 kg)   LMP 09/23/2023 (Approximate)   BMI 28.82 kg/m  General appearance: alert and cooperative Pelvic: cervix normal in appearance, external genitalia normal, no adnexal masses or tenderness, no cervical motion tenderness, rectovaginal septum normal, uterus normal size, shape, and consistency, and vagina normal without discharge   Rectal exam: No rectal bleeding noted on glove Assessment:    Rectal Bleeding.    Plan:    Will plan follow-up with GI. CNM to message their office and request a follow-up appointment. In the meantime, pt will go to ED if rectal bleeding increases or does not stop immediately after passing stool. No evidence of vaginal bleeding. No active rectal bleeding at this time.  Krystal Simmons  12:08pm CNM spoke with CMA at Dr. Laurey Morale office who stated she would call the patient now to schedule a follow-up. [phone] Krystal Simmons

## 2024-01-20 ENCOUNTER — Other Ambulatory Visit: Payer: Self-pay

## 2024-01-20 ENCOUNTER — Inpatient Hospital Stay (HOSPITAL_BASED_OUTPATIENT_CLINIC_OR_DEPARTMENT_OTHER)
Admission: EM | Admit: 2024-01-20 | Discharge: 2024-01-20 | Disposition: A | Discharge status: Home / Self Care | Payer: 59 | Attending: Obstetrics and Gynecology | Admitting: Obstetrics and Gynecology

## 2024-01-20 ENCOUNTER — Emergency Department (HOSPITAL_BASED_OUTPATIENT_CLINIC_OR_DEPARTMENT_OTHER): Payer: 59

## 2024-01-20 ENCOUNTER — Inpatient Hospital Stay (HOSPITAL_COMMUNITY): Payer: 59

## 2024-01-20 ENCOUNTER — Telehealth (HOSPITAL_BASED_OUTPATIENT_CLINIC_OR_DEPARTMENT_OTHER): Payer: Self-pay | Admitting: *Deleted

## 2024-01-20 ENCOUNTER — Encounter (HOSPITAL_BASED_OUTPATIENT_CLINIC_OR_DEPARTMENT_OTHER): Payer: Self-pay | Admitting: *Deleted

## 2024-01-20 DIAGNOSIS — Z3A01 Less than 8 weeks gestation of pregnancy: Secondary | ICD-10-CM | POA: Diagnosis not present

## 2024-01-20 DIAGNOSIS — R1031 Right lower quadrant pain: Secondary | ICD-10-CM

## 2024-01-20 DIAGNOSIS — O009 Unspecified ectopic pregnancy without intrauterine pregnancy: Secondary | ICD-10-CM | POA: Diagnosis not present

## 2024-01-20 DIAGNOSIS — O26891 Other specified pregnancy related conditions, first trimester: Secondary | ICD-10-CM | POA: Insufficient documentation

## 2024-01-20 DIAGNOSIS — O3680X0 Pregnancy with inconclusive fetal viability, not applicable or unspecified: Secondary | ICD-10-CM | POA: Insufficient documentation

## 2024-01-20 DIAGNOSIS — R109 Unspecified abdominal pain: Secondary | ICD-10-CM | POA: Diagnosis not present

## 2024-01-20 DIAGNOSIS — Z3201 Encounter for pregnancy test, result positive: Secondary | ICD-10-CM | POA: Insufficient documentation

## 2024-01-20 HISTORY — DX: Other specified health status: Z78.9

## 2024-01-20 LAB — ABO/RH: ABO/RH(D): B POS

## 2024-01-20 LAB — CBC
HCT: 38.7 % (ref 36.0–46.0)
Hemoglobin: 13.3 g/dL (ref 12.0–15.0)
MCH: 29.2 pg (ref 26.0–34.0)
MCHC: 34.4 g/dL (ref 30.0–36.0)
MCV: 85.1 fL (ref 80.0–100.0)
Platelets: 423 10*3/uL — ABNORMAL HIGH (ref 150–400)
RBC: 4.55 MIL/uL (ref 3.87–5.11)
RDW: 13.2 % (ref 11.5–15.5)
WBC: 13.9 10*3/uL — ABNORMAL HIGH (ref 4.0–10.5)
nRBC: 0 % (ref 0.0–0.2)

## 2024-01-20 LAB — URINALYSIS, MICROSCOPIC (REFLEX)

## 2024-01-20 LAB — URINALYSIS, ROUTINE W REFLEX MICROSCOPIC
Bilirubin Urine: NEGATIVE
Glucose, UA: NEGATIVE mg/dL
Ketones, ur: 15 mg/dL — AB
Leukocytes,Ua: NEGATIVE
Nitrite: NEGATIVE
Protein, ur: NEGATIVE mg/dL
Specific Gravity, Urine: 1.025 (ref 1.005–1.030)
pH: 6.5 (ref 5.0–8.0)

## 2024-01-20 LAB — COMPREHENSIVE METABOLIC PANEL
ALT: 17 U/L (ref 0–44)
AST: 18 U/L (ref 15–41)
Albumin: 4.4 g/dL (ref 3.5–5.0)
Alkaline Phosphatase: 48 U/L (ref 38–126)
Anion gap: 12 (ref 5–15)
BUN: 10 mg/dL (ref 6–20)
CO2: 19 mmol/L — ABNORMAL LOW (ref 22–32)
Calcium: 9.3 mg/dL (ref 8.9–10.3)
Chloride: 105 mmol/L (ref 98–111)
Creatinine, Ser: 0.55 mg/dL (ref 0.44–1.00)
GFR, Estimated: >60 mL/min (ref >60)
Glucose, Bld: 105 mg/dL — ABNORMAL HIGH (ref 70–99)
Potassium: 3.6 mmol/L (ref 3.5–5.1)
Sodium: 136 mmol/L (ref 135–145)
Total Bilirubin: 0.8 mg/dL (ref 0.0–1.2)
Total Protein: 7.8 g/dL (ref 6.5–8.1)

## 2024-01-20 LAB — WET PREP, GENITAL
Clue Cells Wet Prep HPF POC: NONE SEEN
Sperm: NONE SEEN
Trich, Wet Prep: NONE SEEN
WBC, Wet Prep HPF POC: <10 (ref <10)
Yeast Wet Prep HPF POC: NONE SEEN

## 2024-01-20 LAB — PREGNANCY, URINE: Preg Test, Ur: POSITIVE — AB

## 2024-01-20 LAB — HCG, QUANTITATIVE, PREGNANCY: hCG, Beta Chain, Quant, S: 162 m[IU]/mL — ABNORMAL HIGH (ref <5)

## 2024-01-20 LAB — LIPASE, BLOOD: Lipase: 35 U/L (ref 11–51)

## 2024-01-20 MED ORDER — RHO D IMMUNE GLOBULIN 1500 UNIT/2ML IJ SOSY
300.0000 ug | PREFILLED_SYRINGE | Freq: Once | INTRAMUSCULAR | Status: DC
  Filled 2024-01-20: qty 2

## 2024-01-20 MED ORDER — IOHEXOL 300 MG/ML  SOLN
75.0000 mL | Freq: Once | INTRAMUSCULAR | Status: AC | PRN
  Administered 2024-01-20: 75 mL via INTRAVENOUS

## 2024-01-20 MED ORDER — METHOTREXATE FOR ECTOPIC PREGNANCY
50.0000 mg/m2 | Freq: Once | INTRAMUSCULAR | Status: AC
  Administered 2024-01-20: 92.5 mg via INTRAMUSCULAR
  Filled 2024-01-20: qty 3.7

## 2024-01-20 MED ORDER — MORPHINE SULFATE (PF) 4 MG/ML IV SOLN
4.0000 mg | Freq: Once | INTRAVENOUS | Status: AC
  Administered 2024-01-20: 4 mg via INTRAVENOUS
  Filled 2024-01-20: qty 1

## 2024-01-20 MED ORDER — SODIUM CHLORIDE 0.9 % IV BOLUS
1000.0000 mL | Freq: Once | INTRAVENOUS | Status: AC
  Administered 2024-01-20: 1000 mL via INTRAVENOUS

## 2024-01-20 MED ORDER — HYDROMORPHONE HCL 1 MG/ML IJ SOLN
1.0000 mg | Freq: Once | INTRAMUSCULAR | Status: AC
  Administered 2024-01-20: 1 mg via INTRAVENOUS
  Filled 2024-01-20: qty 1

## 2024-01-20 NOTE — Telephone Encounter (Signed)
 Pt called to let us  know that she is currently at ED in [facility] to rule out ectopic pregnancy. Her LMP was the first week of January. She reports heavy bleeding and increased pain. She has had an ultrasound done and is currently being evaluated for appendicitis. Pt provided with appt for follow up HCG tomorrow if she is discharged from the hospital.

## 2024-01-20 NOTE — ED Provider Notes (Signed)
 7:11 AM Patient signed out to me by previous ED physician. P tis a 23 yo female presenting for RLQ pain. Urine preg positive. HCG 162. US  pending.  Wbc 13.9 US  pending.  Physical Exam  BP 122/72   Pulse 62   Temp 98 F (36.7 C) (Oral)   Resp 18   Ht 5' 5 (1.651 m)   Wt 75.3 kg   LMP 01/16/2024   SpO2 98%   BMI 27.62 kg/m   Physical Exam  Procedures  .Critical Care  Performed by: Elnor Bernarda SQUIBB, DO Authorized by: Elnor Bernarda SQUIBB, DO   Critical care provider statement:    Critical care time (minutes):  30   Critical care was time spent personally by me on the following activities:  Development of treatment plan with patient or surrogate, discussions with consultants, evaluation of patient's response to treatment, examination of patient, ordering and review of laboratory studies, ordering and review of radiographic studies, ordering and performing treatments and interventions, pulse oximetry, re-evaluation of patient's condition and review of old charts   Care discussed with: accepting provider at another facility     Care discussed with comment:  Obgyn   ED Course / MDM   Clinical Course as of 01/20/24 1042  Thu Jan 20, 2024  0550 Right lower quadrant abdominal pain  Differential diagnosis and workup considered  Appendicitis, renal colic, ovarian torsion, ectopic pregnancy. [PC]  0719 US  OB LESS THAN 14 WEEKS WITH OB TRANSVAGINAL [AG]  0719 CBC with leukocytosis.  UPT positive.  hCG 162. UA without evidence of infection.  There is hematuria which is likely from menstrual cycle.   Awaiting pelvic ultrasound to rule out ectopic/torsion.  Patient care turned over to oncoming provider. Patient case and results discussed in detail; please see their note for further ED managment.     [PC]    Clinical Course User Index [AG] Elnor Bernarda SQUIBB, DO [PC] Cardama, Raynell Moder, MD   Medical Decision Making Amount and/or Complexity of Data Reviewed Labs: ordered. Radiology:  ordered. Decision-making details documented in ED Course.  Risk Prescription drug management. Decision regarding hospitalization.   Ultrasound demonstrates: -No intrauterine gestational sac visualized. -No ectopic pregnancy visualized. In the appropriate clinical setting follow-up pelvic ultrasound at -10-14 days may be useful for further evaluation of this pregnancy.  Due to patient's leukocytosis, increasing pain levels, and need for multiple doses of morphine .  CT abdomen pelvis with IV contrast has been recommended to rule out appendicitis.  Ectopic pregnancy has also not been excluded at this time.  Radiation exposure to a possible viable pregnancy has been discussed and patient has decided to receive the CT scan.  Risk first benefits explained in waiver consent form has been signed.  CT abdomen pelvis with IV contrast demonstrates: 1. Trace complex fluid in the pelvis, suspicious for hemorrhage. Differential considerations include ruptured ovarian cyst as well as ectopic pregnancy. Close clinical follow-up and serial beta HCG is recommended. Repeat pelvic ultrasound in 14 days is also suggested. 2. Normal appendix.  Patient's controlled significantly on morphine  4 mg IV.  Repeat doses given approximately every 2 hours given imaging waiting periods.  At this time she is resting comfortably in the bed and talking with her friend on the phone.  I spoke with her mom who is up-to-date and on her way.  I attempted to contact Dr. Dianne office on their provider line and was unable to get a hold of anyone.  Consult placed to OB/GYN team.  Spoke with OB/GYN Dr. Eveline who recommends transfer to the MAU for likely ectopic.  We have an ambulance here who is taking a very stable admitted patient.  We will have the ambulance take this patient instead and send for another transfer service for the other patient.     Elnor Hila P, DO 01/20/24 1050

## 2024-01-20 NOTE — MAU Note (Signed)
.  Krystal Simmons is a 23 y.o. at Unknown here in MAU reporting: transfer from [facility] . Presented there with right lower abd pain that awakened her from sleep at 4 am. Pain radiated down her right leg. Also reports nausea.   LMP: 01/16/2024 Onset of complaint: today Pain score: 5/10 Vitals:   01/20/24 1000 01/20/24 1130  BP: 123/85 (!) 114/57  Pulse: 91 63  Resp: 15 16  Temp:  98.4 F (36.9 C)  SpO2: 100% 99%     FHT: na  Lab orders placed from triage:

## 2024-01-20 NOTE — ED Provider Notes (Signed)
 [place] [facility] AT [facility] Provider Note  CSN: [id number] Arrival date & time: 01/20/24 0543  Chief Complaint(s) Abdominal Pain  HPI Krystal Simmons is a 23 y.o. female     Abdominal Pain Pain location:  RLQ Pain radiates to:  R flank Pain severity:  Severe Onset quality:  Gradual Duration:  2 days Timing:  Constant Progression:  Waxing and waning Chronicity:  New Relieved by:  Lying down Worsened by:  Movement and palpation Associated symptoms: nausea and vaginal bleeding (on cycle that started 3 days ago)   Associated symptoms: no chills, no constipation, no cough, no diarrhea, no dysuria, no fever, no vaginal discharge and no vomiting     Past Medical History History reviewed. No pertinent past medical history. Patient Active Problem List   Diagnosis Date Noted   Rectal bleeding 10/21/2023   Encounter for initial prescription of contraceptive pills 09/27/2023   Gynecologic exam normal 09/27/2023   Hydradenitis 08/29/2022   Smoker 08/29/2022   Nicotine use 04/07/2021   Adopted 04/07/2021   History of chlamydia 12/05/2018   Home Medication(s) Prior to Admission medications   Medication Sig Start Date End Date Taking? Authorizing Provider  norethindrone  (MICRONOR ) 0.35 MG tablet Take 1 tablet (0.35 mg total) by mouth daily. 09/27/23 10/31/24  Lo, Arland POUR, CNM  Semaglutide (OZEMPIC, 0.25 OR 0.5 MG/DOSE, Sunflower) Inject 0.5 mg into the skin once a week.    [provider]  Vitamin D, Ergocalciferol, (DRISDOL) 1.25 MG (50000 UNIT) CAPS capsule Take 50,000 Units by mouth once a week. Patient not taking: Reported on 09/27/2023 09/10/22   [provider]                                                                                                                                    Allergies Patient has no known allergies.  Review of Systems Review of Systems  Constitutional:  Negative for chills and fever.  Respiratory:   Negative for cough.   Gastrointestinal:  Positive for abdominal pain and nausea. Negative for constipation, diarrhea and vomiting.  Genitourinary:  Positive for vaginal bleeding (on cycle that started 3 days ago). Negative for dysuria and vaginal discharge.   As noted in HPI  Physical Exam Vital Signs  I have reviewed the triage vital signs BP 122/72   Pulse 62   Temp 98 F (36.7 C) (Oral)   Resp 18   Ht 5' 5 (1.651 m)   Wt 75.3 kg   LMP 01/16/2024   SpO2 98%   BMI 27.62 kg/m   Physical Exam Vitals reviewed.  Constitutional:      General: She is not in acute distress.    Appearance: She is well-developed. She is not diaphoretic.  HENT:     Head: Normocephalic and atraumatic.     Right Ear: External ear normal.     Left Ear: External ear normal.  Nose: Nose normal.  Eyes:     General: No scleral icterus.    Conjunctiva/sclera: Conjunctivae normal.  Neck:     Trachea: Phonation normal.  Cardiovascular:     Rate and Rhythm: Normal rate and regular rhythm.  Pulmonary:     Effort: Pulmonary effort is normal. No respiratory distress.     Breath sounds: No stridor.  Abdominal:     General: There is no distension.     Tenderness: There is abdominal tenderness in the right lower quadrant. Positive signs include Rovsing's sign, psoas sign and obturator sign.  Musculoskeletal:        General: Normal range of motion.     Cervical back: Normal range of motion.  Neurological:     Mental Status: She is alert and oriented to person, place, and time.  Psychiatric:        Behavior: Behavior normal.     ED Results and Treatments Labs (all labs ordered are listed, but only abnormal results are displayed) Labs Reviewed  COMPREHENSIVE METABOLIC PANEL - Abnormal; Notable for the following components:      Result Value   CO2 19 (*)    Glucose, Bld 105 (*)    All other components within normal limits  CBC - Abnormal; Notable for the following components:   WBC 13.9 (*)     Platelets 423 (*)    All other components within normal limits  URINALYSIS, ROUTINE W REFLEX MICROSCOPIC - Abnormal; Notable for the following components:   Hgb urine dipstick MODERATE (*)    Ketones, ur 15 (*)    All other components within normal limits  HCG, QUANTITATIVE, PREGNANCY - Abnormal; Notable for the following components:   hCG, Beta Chain, Quant, S 162 (*)    All other components within normal limits  PREGNANCY, URINE - Abnormal; Notable for the following components:   Preg Test, Ur POSITIVE (*)    All other components within normal limits  URINALYSIS, MICROSCOPIC (REFLEX) - Abnormal; Notable for the following components:   Bacteria, UA RARE (*)    All other components within normal limits  LIPASE, BLOOD                                                                                                                         EKG  EKG Interpretation Date/Time:    Ventricular Rate:    PR Interval:    QRS Duration:    QT Interval:    QTC Calculation:   R Axis:      Text Interpretation:         Radiology No results found.  Medications Ordered in ED Medications  morphine  (PF) 4 MG/ML injection 4 mg (has no administration in time range)  sodium chloride  0.9 % bolus 1,000 mL (1,000 mLs Intravenous New Bag/Given 01/20/24 0617)  morphine  (PF) 4 MG/ML injection 4 mg (4 mg Intravenous Given 01/20/24 0617)   Procedures .Critical Care  Performed by: Trine Raynell Moder, MD Authorized by:  Trine Raynell Moder, MD   Critical care provider statement:    Critical care time (minutes):  30   Critical care was necessary to treat or prevent imminent or life-threatening deterioration of the following conditions: likely ectopic pregnancy.   Critical care was time spent personally by me on the following activities:  Development of treatment plan with patient or surrogate, discussions with consultants, evaluation of patient's response to treatment, examination of patient, ordering  and review of laboratory studies, ordering and review of radiographic studies, ordering and performing treatments and interventions, pulse oximetry, re-evaluation of patient's condition and review of old charts   (including critical care time) Medical Decision Making / ED Course   Medical Decision Making Amount and/or Complexity of Data Reviewed Labs: ordered. Decision-making details documented in ED Course. Radiology: ordered. Decision-making details documented in ED Course.  Risk Prescription drug management. Parenteral controlled substances. Decision regarding hospitalization.     Clinical Course as of 01/20/24 0720  Thu Jan 20, 2024  0550 Right lower quadrant abdominal pain  Differential diagnosis and workup considered  Appendicitis, renal colic, ovarian torsion, ectopic pregnancy. [PC]  0719 US  OB LESS THAN 14 WEEKS WITH OB TRANSVAGINAL [AG]  0719 CBC with leukocytosis.  UPT positive.  hCG 162. UA without evidence of infection.  There is hematuria which is likely from menstrual cycle.   Awaiting pelvic ultrasound to rule out ectopic/torsion.  Patient care turned over to oncoming provider. Patient case and results discussed in detail; please see their note for further ED managment.     [PC]    Clinical Course User Index [AG] Elnor Bernarda SQUIBB, DO [PC] Regina Ganci, Raynell Moder, MD    Final Clinical Impression(s) / ED Diagnoses Final diagnoses:  None    This chart was dictated using voice recognition software.  Despite best efforts to proofread,  errors can occur which can change the documentation meaning.    Trine Raynell Moder, MD 01/20/24 2356

## 2024-01-20 NOTE — MAU Provider Note (Signed)
 History     CSN: [id number]  Arrival date and time: 01/20/24 0543   Event Date/Time   First Provider Initiated Contact with Patient 01/20/24 1149        Krystal Simmons , a  23 y.o. G1P0000 at Unknown presents to MAU via [facility] transfer from [facility] with complaints of sharp stabbing right sided abdominal pain that started this morning at 3am. She states that the pain was awoken out of her sleep. Attempted heat and pain only got worse. At that time she reports 10/10. Currently rates as a 8/10. Was treated at other hospital with multiple doses of Morphine  that she states would bring it down a 5, but as it would wear off she reports would go back up to a 8/10. Also notes vaginal bleeding. She states that she started spotting 2 days ago but last night she states it became heavier and noted going through only 1 pad today. Denies passing clots. She reports consistent use of Birth Control and does not desire pregnancy. She states that her LMP was the first week in January. This is a undesired pregnancy.   Patients Mother At bedside Contributing to HPI.           OB History     Gravida  1   Para  0   Term  0   Preterm  0   AB  0   Living  0      SAB  0   IAB  0   Ectopic  0   Multiple  0   Live Births  0           Past Medical History:  Diagnosis Date   Medical history non-contributory     Past Surgical History:  Procedure Laterality Date   BREAST REDUCTION SURGERY  02/20/2020   Dr. Verneda   TONSILLECTOMY  2018   WISDOM TOOTH EXTRACTION  2018    Family History  Adopted: Yes    Social History   Tobacco Use   Smoking status: Every Day    Types: E-cigarettes   Smokeless tobacco: Never  Vaping Use   Vaping status: Every Day   Substances: Nicotine, Flavoring  Substance Use Topics   Alcohol use: Yes    Alcohol/week: 2.0 standard drinks of alcohol    Types: 1 Cans of beer, 1 Standard drinks or equivalent per week   Drug use: Not Currently    Types:  Cocaine, Marijuana    Allergies: No Known Allergies  Medications Prior to Admission  Medication Sig Dispense Refill Last Dose/Taking   norethindrone  (MICRONOR ) 0.35 MG tablet Take 1 tablet (0.35 mg total) by mouth daily. 90 tablet 4 01/19/2024   Semaglutide (OZEMPIC, 0.25 OR 0.5 MG/DOSE, [place]pex) Inject 0.5 mg into the skin once a week.   Past Month   Vitamin D, Ergocalciferol, (DRISDOL) 1.25 MG (50000 UNIT) CAPS capsule Take 50,000 Units by mouth once a week. (Patient not taking: Reported on 09/27/2023)       Review of Systems  Constitutional:  Negative for chills, fatigue and fever.  Eyes:  Negative for pain and visual disturbance.  Respiratory:  Negative for apnea, shortness of breath and wheezing.   Cardiovascular:  Negative for chest pain and palpitations.  Gastrointestinal:  Positive for abdominal pain and nausea. Negative for constipation and diarrhea.  Genitourinary:  Positive for vaginal bleeding. Negative for difficulty urinating, dysuria, pelvic pain, vaginal discharge and vaginal pain.  Musculoskeletal:  Positive for back pain.  Neurological:  Negative  for seizures, weakness and headaches.  Psychiatric/Behavioral:  Negative for suicidal ideas.    Physical Exam   Blood pressure (!) 114/57, pulse 63, temperature 98.4 F (36.9 C), temperature source Oral, resp. rate 16, height 5' 5 (1.651 m), weight 75.3 kg, last menstrual period 01/16/2024, SpO2 99%.  Physical Exam Vitals and nursing note reviewed.  Constitutional:      General: She is not in acute distress.    Appearance: Normal appearance.  HENT:     Head: Normocephalic.  Cardiovascular:     Rate and Rhythm: Normal rate and regular rhythm.  Pulmonary:     Effort: Pulmonary effort is normal.  Abdominal:     Palpations: Abdomen is soft.     Tenderness: There is abdominal tenderness. There is guarding. There is no rebound.     Comments: RLQ  Musculoskeletal:     Cervical back: Normal range of motion.  Skin:     General: Skin is warm and dry.     Coloration: Skin is pale.  Neurological:     Mental Status: She is alert and oriented to person, place, and time.  Psychiatric:        Mood and Affect: Mood normal.     MAU Course  Procedures Orders Placed This Encounter  Procedures   Critical Care   Wet prep, genital   US  OB LESS THAN 14 WEEKS WITH OB TRANSVAGINAL   CT ABDOMEN PELVIS W CONTRAST   US  OB Transvaginal   Lipase, blood   Comprehensive metabolic panel   CBC   Urinalysis, Routine w reflex microscopic -Urine, Clean Catch   hCG, quantitative, pregnancy   Pregnancy, urine   Urinalysis, Microscopic (reflex)   Diet NPO time specified   Consult to obstetrics / gynecology   ABO/Rh   Meds ordered this encounter  Medications   sodium chloride  0.9 % bolus 1,000 mL   morphine  (PF) 4 MG/ML injection 4 mg   morphine  (PF) 4 MG/ML injection 4 mg   morphine  (PF) 4 MG/ML injection 4 mg   iohexol  (OMNIPAQUE ) 300 MG/ML solution 75 mL   morphine  (PF) 4 MG/ML injection 4 mg   HYDROmorphone  (DILAUDID ) injection 1 mg    Refill:  0   methotrexate  (for ectopic pregnancy) 25 mg/mL chemo injection   rho (d) immune globulin  (RHIG/RHOPHYLAC ) injection 300 mcg   Results for orders placed or performed during the hospital encounter of 01/20/24 (from the past 24 hours)  Lipase, blood     Status: None   Collection Time: 01/20/24  5:55 AM  Result Value Ref Range   Lipase 35 11 - 51 U/L  Comprehensive metabolic panel     Status: Abnormal   Collection Time: 01/20/24  5:55 AM  Result Value Ref Range   Sodium 136 135 - 145 mmol/L   Potassium 3.6 3.5 - 5.1 mmol/L   Chloride 105 98 - 111 mmol/L   CO2 19 (L) 22 - 32 mmol/L   Glucose, Bld 105 (H) 70 - 99 mg/dL   BUN 10 6 - 20 mg/dL   Creatinine, Ser 9.44 0.44 - 1.00 mg/dL   Calcium 9.3 8.9 - 89.6 mg/dL   Total Protein 7.8 6.5 - 8.1 g/dL   Albumin 4.4 3.5 - 5.0 g/dL   AST 18 15 - 41 U/L   ALT 17 0 - 44 U/L   Alkaline Phosphatase 48 38 - 126 U/L   Total  Bilirubin 0.8 0.0 - 1.2 mg/dL   GFR, Estimated >39 >39 mL/min  Anion gap 12 5 - 15  CBC     Status: Abnormal   Collection Time: 01/20/24  5:55 AM  Result Value Ref Range   WBC 13.9 (H) 4.0 - 10.5 K/uL   RBC 4.55 3.87 - 5.11 MIL/uL   Hemoglobin 13.3 12.0 - 15.0 g/dL   HCT 61.2 63.9 - 53.9 %   MCV 85.1 80.0 - 100.0 fL   MCH 29.2 26.0 - 34.0 pg   MCHC 34.4 30.0 - 36.0 g/dL   RDW 86.7 88.4 - 84.4 %   Platelets 423 (H) 150 - 400 K/uL   nRBC 0.0 0.0 - 0.2 %  Urinalysis, Routine w reflex microscopic -Urine, Clean Catch     Status: Abnormal   Collection Time: 01/20/24  5:55 AM  Result Value Ref Range   Color, Urine YELLOW YELLOW   APPearance CLEAR CLEAR   Specific Gravity, Urine 1.025 1.005 - 1.030   pH 6.5 5.0 - 8.0   Glucose, UA NEGATIVE NEGATIVE mg/dL   Hgb urine dipstick MODERATE (A) NEGATIVE   Bilirubin Urine NEGATIVE NEGATIVE   Ketones, ur 15 (A) NEGATIVE mg/dL   Protein, ur NEGATIVE NEGATIVE mg/dL   Nitrite NEGATIVE NEGATIVE   Leukocytes,Ua NEGATIVE NEGATIVE  hCG, quantitative, pregnancy     Status: Abnormal   Collection Time: 01/20/24  5:55 AM  Result Value Ref Range   hCG, Beta Chain, Quant, S 162 (H) <5 mIU/mL  Pregnancy, urine     Status: Abnormal   Collection Time: 01/20/24  5:55 AM  Result Value Ref Range   Preg Test, Ur POSITIVE (A) NEGATIVE  Urinalysis, Microscopic (reflex)     Status: Abnormal   Collection Time: 01/20/24  5:55 AM  Result Value Ref Range   RBC / HPF 21-50 0 - 5 RBC/hpf   WBC, UA 0-5 0 - 5 WBC/hpf   Bacteria, UA RARE (A) NONE SEEN   Squamous Epithelial / HPF 0-5 0 - 5 /HPF   Mucus PRESENT    US  OB Transvaginal Result Date: 01/20/2024 CLINICAL DATA:  Right lower abdominal pain with suspected ectopic pregnancy. Beta hCG = 162 EXAM: TRANSVAGINAL OB ULTRASOUND TECHNIQUE: Transvaginal ultrasound was performed for complete evaluation of the gestation as well as the maternal uterus, adnexal regions, and pelvic cul-de-sac. COMPARISON:  Earlier same day  ultrasound examination and CT abdomen and pelvis FINDINGS: Intrauterine gestational sac: None Yolk sac:  Not Visualized. Embryo:  Not Visualized. Subchorionic hemorrhage:  None visualized. Maternal uterus/adnexae: Endometrium measures 5 mm. Normal ovaries. Right ovarian corpus luteum. Within the right adnexa located inferolateral to the right ovary, there is a heterogeneously hypoechoic structure abutting and inseparable from the right ovary measuring 2.4 x 1.8 x 1.6 cm. No substantial internal vascularity. Pelvic free fluid noted on same-day CT is not well seen on this examination. IMPRESSION: 1. Pregnancy of unknown location. Intrauterine gestational sac is not seen. 2. Rounded, heterogeneously hypoechoic structure within the right adnexa inseparable from the right ovary may reflect collapsed hemorrhagic cyst or possibly early ectopic pregnancy. Critical Value/emergent results were called by telephone at the time of interpretation on 01/20/2024 at 1:34 pm to provider Gerrard Crystal , who verbally acknowledged these results. Electronically Signed   By: Limin  Xu M.D.   On: 01/20/2024 13:37   CT ABDOMEN PELVIS W CONTRAST Result Date: 01/20/2024 CLINICAL DATA:  Right lower abdominal pain. Positive pregnancy test. Appropriate consent obtained prior to scanning. EXAM: CT ABDOMEN AND PELVIS WITH CONTRAST TECHNIQUE: Multidetector CT imaging of the abdomen and pelvis  was performed using the standard protocol following bolus administration of intravenous contrast. RADIATION DOSE REDUCTION: This exam was performed according to the departmental dose-optimization program which includes automated exposure control, adjustment of the mA and/or kV according to patient size and/or use of iterative reconstruction technique. CONTRAST:  75mL OMNIPAQUE  IOHEXOL  300 MG/ML  SOLN COMPARISON:  OB ultrasound from same day. FINDINGS: Lower chest: No acute abnormality. Hepatobiliary: No focal liver abnormality is seen. No gallstones,  gallbladder wall thickening, or biliary dilatation. Pancreas: Unremarkable. No pancreatic ductal dilatation or surrounding inflammatory changes. Spleen: Normal in size without focal abnormality. Adrenals/Urinary Tract: Adrenal glands are unremarkable. Kidneys are normal, without renal calculi, focal lesion, or hydronephrosis. Bladder is unremarkable. Stomach/Bowel: Stomach is within normal limits. Appendix appears normal. No evidence of bowel wall thickening, distention, or inflammatory changes. Vascular/Lymphatic: No significant vascular findings are present. No enlarged abdominal or pelvic lymph nodes. Reproductive: Uterus and bilateral adnexa are unremarkable. Other: Trace intermediate density fluid in the cul-de-sac. No pneumoperitoneum. Musculoskeletal: No acute or significant osseous findings. IMPRESSION: 1. Trace complex fluid in the pelvis, suspicious for hemorrhage. Differential considerations include ruptured ovarian cyst as well as ectopic pregnancy. Close clinical follow-up and serial beta HCG is recommended. Repeat pelvic ultrasound in 14 days is also suggested. 2. Normal appendix. Electronically Signed   By: Elsie ONEIDA Shoulder M.D.   On: 01/20/2024 09:53   US  OB LESS THAN 14 WEEKS WITH OB TRANSVAGINAL Result Date: 01/20/2024 CLINICAL DATA:  Positive pregnancy test. Quantitative beta HCG is 162. Right lower quadrant pain. Last menstrual. 12/15/2023. EXAM: OBSTETRIC <14 WK US  AND TRANSVAGINAL OB US  TECHNIQUE: Both transabdominal and transvaginal ultrasound examinations were performed for complete evaluation of the gestation as well as the maternal uterus, adnexal regions, and pelvic cul-de-sac. Transvaginal technique was performed to assess early pregnancy. COMPARISON:  None Available. FINDINGS: Intrauterine gestational sac: None visualized. Yolk sac:  Not visualized. Embryo:  Not visualized. Subchorionic hemorrhage:  None visualized. Maternal uterus/adnexae: Uterus and adnexa are unremarkable.  IMPRESSION: No intrauterine gestational sac visualized. No ectopic pregnancy visualized. In the appropriate clinical setting follow-up pelvic ultrasound at 10-14 days may be useful for further evaluation of this pregnancy. Electronically Signed   By: Lonni Necessary M.D.   On: 01/20/2024 07:36    MDM - Lab results and Imaging from other facility reviewed.  - IV dilaudid  given for pain.  - Given RLQ tenderness and pain unrelieved with several doses of Morphine , concern for Ectopic. Repeat US  in MAU.  - Quant results from [facility] 162 - Call from Radiology noted a rounded hypoechoic structure in the right Adnexa, no significant vascularity, but ectopic cannot be excluded.  Vs hemorrhagic cyst.  - Reviewed labs, US  results CT imaging and current clinical picture with Dr. Alger. Given patient localized pain that was previously unrelieved and findings of suspicious mass possibly concerning for Ectopic, plan to treat with Methotrexate  with close follow up.  - Reviewed findings with patient and patients mother and all questions answered at patient bedside. - Reviewed possibility of pregnancy too early to visualize and Methotrexate  may not be the best option if IUP develops. Patient verbalized understanding and  Patient desires to proceed with Methotrexate .  - The risks of methotrexate  were reviewed including failure requiring repeat dosing or eventual surgery. She understands that methotrexate  involves frequent return visits to monitor lab values and that she remains at risk of ectopic rupture until her beta is less than assay. ?The patient opts to proceed with methotrexate .  She has no  history of hepatic or renal dysfunction, has normal BUN/Cr/LFT's/platelets.  She is felt to be reliable for follow-up. Side effects of photosensitivity & GI upset were discussed.  She knows to avoid direct sunlight and abstain from alcohol, NSAIDs and sexual intercourse for two weeks. She was counseled to discontinue any MVI  with folic acid. ?She understands to follow up on D4 (Sunday) and D7 (Wednesday) for repeat BHCG and was given the instruction sheet. ?Strict ectopic precautions were reviewed, the patient knows to call with any abdominal pain, vomiting, fainting, or any concerns with her health.  Day 0/1 Day 4 Day 7  Sunday Wednesday Saturday  Monday Thursday Sunday  Tuesday Friday Monday  Wednesday Saturday Tuesday  Thursday Sunday Wednesday  Friday Monday Thursday  Saturday Tuesday Friday   Assessment and Plan   1. RLQ abdominal pain   2. Ectopic pregnancy, unspecified location, unspecified whether intrauterine pregnancy present   3. [redacted] weeks gestation of pregnancy    - Reviewed worsening signs and return precautions.  - Strict return ectopic precautions also given.  - Reviewed bleeding and cramping and expectations following Methotrexate  treatment for ectopic pregnancy.  - Patient discharged home in stable condition and may return to MAU as needed.   Claris CHRISTELLA Cedar, MSN CNM  01/20/2024, 11:49 AM

## 2024-01-20 NOTE — ED Notes (Signed)
 ED Provider at bedside.

## 2024-01-20 NOTE — ED Triage Notes (Addendum)
 Right lower abdominal pain that awoke her from sleep around 1 hour ago. Pain goes down her right upper leg. Nausea. Denies fevers. Currently on her period. LBM this morning, reports as normal.

## 2024-01-20 NOTE — Discharge Instructions (Signed)
 Follow up on D4 (Sunday) and D7 (Wednesday) for repeat BHCG and was given the instruction sheet. ?Strict ectopic precautions were reviewed, the patient knows to call with any abdominal pain, vomiting, fainting, or any concerns with her health.  Day 0/1 Day 4 Day 7  Sunday Wednesday Saturday  Monday Thursday Sunday  Tuesday Friday Monday  Wednesday Saturday Tuesday  Thursday Sunday Wednesday  Friday Monday Thursday  Saturday Tuesday Friday

## 2024-01-21 LAB — GC/CHLAMYDIA PROBE AMP (~~LOC~~) NOT AT ARMC
Chlamydia: NEGATIVE
Comment: NEGATIVE
Comment: NORMAL
Neisseria Gonorrhea: NEGATIVE

## 2024-01-21 NOTE — Telephone Encounter (Signed)
 Called pt to follow up. Pt was discharged from hospital and is doing well.

## 2024-01-23 ENCOUNTER — Inpatient Hospital Stay (HOSPITAL_COMMUNITY)
Admission: AD | Admit: 2024-01-23 | Discharge: 2024-01-23 | Disposition: A | Discharge status: Home / Self Care | Payer: 59 | Attending: Obstetrics and Gynecology | Admitting: Obstetrics and Gynecology

## 2024-01-23 DIAGNOSIS — O00101 Right tubal pregnancy without intrauterine pregnancy: Secondary | ICD-10-CM | POA: Diagnosis not present

## 2024-01-23 DIAGNOSIS — R1030 Lower abdominal pain, unspecified: Secondary | ICD-10-CM | POA: Diagnosis present

## 2024-01-23 DIAGNOSIS — R110 Nausea: Secondary | ICD-10-CM | POA: Diagnosis present

## 2024-01-23 LAB — HCG, QUANTITATIVE, PREGNANCY: hCG, Beta Chain, Quant, S: 114 m[IU]/mL — ABNORMAL HIGH (ref <5)

## 2024-01-23 MED ORDER — OXYCODONE HCL 5 MG PO TABS: 5.0000 mg | ORAL_TABLET | Freq: Four times a day (QID) | ORAL | 0 refills | Status: AC | PRN

## 2024-01-23 NOTE — MAU Note (Signed)
 Krystal Simmons is a 23 y.o. at Unknown here in MAU reporting: day 4 post methotrexate .  Still having pain lower abd in the middle, like my uterus is concave, like when you go to the bathroom, that emptiness.  Will have occ pain shooting down into leg, or like a string pulling up to her belly button.  Has been nauseated. Just don't feel good.  Tylenol , not helping pain. Small amt of bleeding, not filling pad. Onset of complaint: ongoing Pain score: 5 Vitals:   01/23/24 1311  BP: 114/82  Pulse: 68  Resp: 17  Temp: 98.3 F (36.8 C)  SpO2: 100%      Lab orders placed from triage:  blood work drawn.

## 2024-01-23 NOTE — MAU Provider Note (Signed)
 Chief Complaint: Follow-up   Event Date/Time   First Provider Initiated Contact with Patient 01/23/24 1329      SUBJECTIVE HPI: Krystal Simmons is a 23 y.o. G1P0000 who presents to maternity admissions reporting need for repeat HCG.  Patient presented with RLQ pain 2/6 and found to have rounded hypoechoic structure in right adnexa which was suspected ectopic vs hemorrhagic cyst. Given complete picture, treated with methotrexate  for ectopic. She notes pain is continued from Thursday. Mildly improved but still severe. Difficult to sleep. Denies heavy bleeding, fever/chills, N/V.  HPI  Past Medical History:  Diagnosis Date   Medical history non-contributory    Past Surgical History:  Procedure Laterality Date   BREAST REDUCTION SURGERY  02/20/2020   Dr. Verneda   TONSILLECTOMY  2018   WISDOM TOOTH EXTRACTION  2018   Social History   Socioeconomic History   Marital status: Single    Spouse name: Not on file   Number of children: 0   Years of education: Not on file   Highest education level: Not on file  Occupational History   Not on file  Tobacco Use   Smoking status: Every Day    Types: E-cigarettes   Smokeless tobacco: Never  Vaping Use   Vaping status: Every Day   Substances: Nicotine, Flavoring  Substance and Sexual Activity   Alcohol use: Yes    Alcohol/week: 2.0 standard drinks of alcohol    Types: 1 Cans of beer, 1 Standard drinks or equivalent per week   Drug use: Not Currently    Types: Cocaine, Marijuana   Sexual activity: Yes    Birth control/protection: Pill  Other Topics Concern   Not on file  Social History Narrative   Not on file   Social Drivers of Health   Financial Resource Strain: Not on file  Food Insecurity: Not on file  Transportation Needs: Not on file  Physical Activity: Not on file  Stress: Not on file  Social Connections: Not on file  Intimate Partner Violence: Not on file   No current facility-administered medications on file  prior to encounter.   Current Outpatient Medications on File Prior to Encounter  Medication Sig Dispense Refill   norethindrone  (MICRONOR ) 0.35 MG tablet Take 1 tablet (0.35 mg total) by mouth daily. 90 tablet 4   Semaglutide (OZEMPIC, 0.25 OR 0.5 MG/DOSE, [place]) Inject 0.5 mg into the skin once a week.     Vitamin D, Ergocalciferol, (DRISDOL) 1.25 MG (50000 UNIT) CAPS capsule Take 50,000 Units by mouth once a week. (Patient not taking: Reported on 09/27/2023)     No Known Allergies  ROS:  Pertinent positives/negatives listed above.  I have reviewed patient's Past Medical Hx, Surgical Hx, Family Hx, Social Hx, medications and allergies.   Physical Exam  Patient Vitals for the past 24 hrs:  BP Temp Temp src Pulse Resp SpO2  01/23/24 1311 114/82 98.3 F (36.8 C) Oral 68 17 100 %   Constitutional: Well-developed, well-nourished female. Uncomfortable Cardiovascular: normal rate Respiratory: normal effort GI: Abd soft, tender over suprapubic region. No guarding/rebound MS: Extremities nontender, no edema, normal ROM Neurologic: Alert and oriented x 4  GU: Neg CVAT  LAB RESULTS Results for orders placed or performed during the hospital encounter of 01/23/24 (from the past 24 hours)  hCG, quantitative, pregnancy     Status: Abnormal   Collection Time: 01/23/24  1:08 PM  Result Value Ref Range   hCG, Beta Chain, Quant, S 114 (H) <5 mIU/mL    --/--/  B POS (02/06 1429)  IMAGING US  OB Transvaginal Result Date: 01/20/2024 CLINICAL DATA:  Right lower abdominal pain with suspected ectopic pregnancy. Beta hCG = 162 EXAM: TRANSVAGINAL OB ULTRASOUND TECHNIQUE: Transvaginal ultrasound was performed for complete evaluation of the gestation as well as the maternal uterus, adnexal regions, and pelvic cul-de-sac. COMPARISON:  Earlier same day ultrasound examination and CT abdomen and pelvis FINDINGS: Intrauterine gestational sac: None Yolk sac:  Not Visualized. Embryo:  Not Visualized. Subchorionic  hemorrhage:  None visualized. Maternal uterus/adnexae: Endometrium measures 5 mm. Normal ovaries. Right ovarian corpus luteum. Within the right adnexa located inferolateral to the right ovary, there is a heterogeneously hypoechoic structure abutting and inseparable from the right ovary measuring 2.4 x 1.8 x 1.6 cm. No substantial internal vascularity. Pelvic free fluid noted on same-day CT is not well seen on this examination. IMPRESSION: 1. Pregnancy of unknown location. Intrauterine gestational sac is not seen. 2. Rounded, heterogeneously hypoechoic structure within the right adnexa inseparable from the right ovary may reflect collapsed hemorrhagic cyst or possibly early ectopic pregnancy. Critical Value/emergent results were called by telephone at the time of interpretation on 01/20/2024 at 1:34 pm to provider SHANTONETTE PAYNE , who verbally acknowledged these results. Electronically Signed   By: Limin  Xu M.D.   On: 01/20/2024 13:37   CT ABDOMEN PELVIS W CONTRAST Result Date: 01/20/2024 CLINICAL DATA:  Right lower abdominal pain. Positive pregnancy test. Appropriate consent obtained prior to scanning. EXAM: CT ABDOMEN AND PELVIS WITH CONTRAST TECHNIQUE: Multidetector CT imaging of the abdomen and pelvis was performed using the standard protocol following bolus administration of intravenous contrast. RADIATION DOSE REDUCTION: This exam was performed according to the departmental dose-optimization program which includes automated exposure control, adjustment of the mA and/or kV according to patient size and/or use of iterative reconstruction technique. CONTRAST:  75mL OMNIPAQUE  IOHEXOL  300 MG/ML  SOLN COMPARISON:  OB ultrasound from same day. FINDINGS: Lower chest: No acute abnormality. Hepatobiliary: No focal liver abnormality is seen. No gallstones, gallbladder wall thickening, or biliary dilatation. Pancreas: Unremarkable. No pancreatic ductal dilatation or surrounding inflammatory changes. Spleen: Normal in  size without focal abnormality. Adrenals/Urinary Tract: Adrenal glands are unremarkable. Kidneys are normal, without renal calculi, focal lesion, or hydronephrosis. Bladder is unremarkable. Stomach/Bowel: Stomach is within normal limits. Appendix appears normal. No evidence of bowel wall thickening, distention, or inflammatory changes. Vascular/Lymphatic: No significant vascular findings are present. No enlarged abdominal or pelvic lymph nodes. Reproductive: Uterus and bilateral adnexa are unremarkable. Other: Trace intermediate density fluid in the cul-de-sac. No pneumoperitoneum. Musculoskeletal: No acute or significant osseous findings. IMPRESSION: 1. Trace complex fluid in the pelvis, suspicious for hemorrhage. Differential considerations include ruptured ovarian cyst as well as ectopic pregnancy. Close clinical follow-up and serial beta HCG is recommended. Repeat pelvic ultrasound in 14 days is also suggested. 2. Normal appendix. Electronically Signed   By: Elsie ONEIDA Shoulder M.D.   On: 01/20/2024 09:53   US  OB LESS THAN 14 WEEKS WITH OB TRANSVAGINAL Result Date: 01/20/2024 CLINICAL DATA:  Positive pregnancy test. Quantitative beta HCG is 162. Right lower quadrant pain. Last menstrual. 12/15/2023. EXAM: OBSTETRIC <14 WK US  AND TRANSVAGINAL OB US  TECHNIQUE: Both transabdominal and transvaginal ultrasound examinations were performed for complete evaluation of the gestation as well as the maternal uterus, adnexal regions, and pelvic cul-de-sac. Transvaginal technique was performed to assess early pregnancy. COMPARISON:  None Available. FINDINGS: Intrauterine gestational sac: None visualized. Yolk sac:  Not visualized. Embryo:  Not visualized. Subchorionic hemorrhage:  None visualized. Maternal uterus/adnexae: Uterus  and adnexa are unremarkable. IMPRESSION: No intrauterine gestational sac visualized. No ectopic pregnancy visualized. In the appropriate clinical setting follow-up pelvic ultrasound at 10-14 days may be  useful for further evaluation of this pregnancy. Electronically Signed   By: Lonni Necessary M.D.   On: 01/20/2024 07:36    MAU Management/MDM: Orders Placed This Encounter  Procedures   hCG, quantitative, pregnancy   Discharge patient    Meds ordered this encounter  Medications   oxyCODONE  (ROXICODONE ) 5 MG immediate release tablet    Sig: Take 1-2 tablets (5-10 mg total) by mouth every 6 (six) hours as needed for severe pain (pain score 7-10).    Dispense:  15 tablet    Refill:  0    Patient seen for follow-up HCG after first dose of methotrexate  for ectopic given Thursday. Pain has mildly improved but persisted. She reassuringly has normal vital signs, no rebound/guarding, no heavy bleeding, lightheaded/dizziness to suggest rupture of ectopic pregnancy. HCG is reassuringly down-trending. Oxycodone  given for pain as NSAIDs contraindicated with methotrexate . She will return Wednesday for repeat HCG.  Strict ectopic precautions were reviewed, the patient knows to call with any abdominal pain, vomiting, fainting, or any concerns with her health.  Day 0/1 Day 4 Day 7  Sunday Wednesday Saturday  Monday Thursday Sunday  Tuesday Friday Monday  Wednesday Saturday Tuesday  Thursday Sunday Wednesday  Friday Monday Thursday  Saturday Tuesday Friday   ASSESSMENT 1. Right tubal pregnancy without intrauterine pregnancy     PLAN Discharge home with strict return precautions. Allergies as of 01/23/2024   No Known Allergies      Medication List     STOP taking these medications    norethindrone  0.35 MG tablet Commonly known as: MICRONOR    Vitamin D (Ergocalciferol) 1.25 MG (50000 UNIT) Caps capsule Commonly known as: DRISDOL       TAKE these medications    oxyCODONE  5 MG immediate release tablet Commonly known as: Roxicodone  Take 1-2 tablets (5-10 mg total) by mouth every 6 (six) hours as needed for severe pain (pain score 7-10).   OZEMPIC (0.25 OR 0.5 MG/DOSE)  [place] Inject 0.5 mg into the skin once a week.         Almarie Moats, MD OB Fellow 01/23/2024  2:04 PM

## 2024-01-24 ENCOUNTER — Ambulatory Visit (HOSPITAL_BASED_OUTPATIENT_CLINIC_OR_DEPARTMENT_OTHER): Payer: 59 | Admitting: Obstetrics & Gynecology

## 2024-01-26 ENCOUNTER — Inpatient Hospital Stay (HOSPITAL_COMMUNITY)
Admission: AD | Admit: 2024-01-26 | Discharge: 2024-01-26 | Disposition: A | Discharge status: Home / Self Care | Payer: Self-pay | Attending: Obstetrics and Gynecology | Admitting: Obstetrics and Gynecology

## 2024-01-26 DIAGNOSIS — O00101 Right tubal pregnancy without intrauterine pregnancy: Secondary | ICD-10-CM | POA: Diagnosis present

## 2024-01-26 DIAGNOSIS — Z3A23 23 weeks gestation of pregnancy: Secondary | ICD-10-CM | POA: Insufficient documentation

## 2024-01-26 LAB — HCG, QUANTITATIVE, PREGNANCY: hCG, Beta Chain, Quant, S: 54 m[IU]/mL — ABNORMAL HIGH (ref <5)

## 2024-01-26 NOTE — MAU Provider Note (Signed)
History   Chief Complaint:  Follow-up   Krystal Simmons is  23 y.o. G1P0000 Patient's last menstrual period was 01/16/2024.Marland Kitchen Patient is here for follow up of quantitative HCG and ongoing surveillance of pregnancy status. She is Unknown weeks gestation  by LMP.    Since her last visit, the patient is without new complaint. The patient reports bleeding as  spotting.  She denies any pain.  General ROS:  positive  Her previous Quantitative HCG values are:  01/20/2024 = 162 (Day 0/1 MTX) 01/23/2024 = 114  Physical Exam   Blood pressure 107/67, pulse 75, temperature 98.4 F (36.9 C), resp. rate 18, last menstrual period 01/16/2024.  Focused Gynecological Exam: examination not indicated  Labs: Results for orders placed or performed during the hospital encounter of 01/26/24 (from the past 24 hours)  hCG, quantitative, pregnancy   Collection Time: 01/26/24  7:30 AM  Result Value Ref Range   hCG, Beta Chain, Quant, S 54 (H) <5 mIU/mL    Ultrasound Studies:   US OB Transvaginal Result Date: 01/20/2024 CLINICAL DATA:  Right lower abdominal pain with suspected ectopic pregnancy. Beta hCG = 162 EXAM: TRANSVAGINAL OB ULTRASOUND TECHNIQUE: Transvaginal ultrasound was performed for complete evaluation of the gestation as well as the maternal uterus, adnexal regions, and pelvic cul-de-sac. COMPARISON:  Earlier same day ultrasound examination and CT abdomen and pelvis FINDINGS: Intrauterine gestational sac: None Yolk sac:  Not Visualized. Embryo:  Not Visualized. Subchorionic hemorrhage:  None visualized. Maternal uterus/adnexae: Endometrium measures 5 mm. Normal ovaries. Right ovarian corpus luteum. Within the right adnexa located inferolateral to the right ovary, there is a heterogeneously hypoechoic structure abutting and inseparable from the right ovary measuring 2.4 x 1.8 x 1.6 cm. No substantial internal vascularity. Pelvic free fluid noted on same-day CT is not well seen on this examination.  IMPRESSION: 1. Pregnancy of unknown location. Intrauterine gestational sac is not seen. 2. Rounded, heterogeneously hypoechoic structure within the right adnexa inseparable from the right ovary may reflect collapsed hemorrhagic cyst or possibly early ectopic pregnancy. Critical Value/emergent results were called by telephone at the time of interpretation on 01/20/2024 at 1:34 pm to provider [facility] , who verbally acknowledged these results. Electronically Signed   By: Agustin Cree M.D.   On: 01/20/2024 13:37   CT ABDOMEN PELVIS W CONTRAST Result Date: 01/20/2024 CLINICAL DATA:  Right lower abdominal pain. Positive pregnancy test. Appropriate consent obtained prior to scanning. EXAM: CT ABDOMEN AND PELVIS WITH CONTRAST TECHNIQUE: Multidetector CT imaging of the abdomen and pelvis was performed using the standard protocol following bolus administration of intravenous contrast. RADIATION DOSE REDUCTION: This exam was performed according to the departmental dose-optimization program which includes automated exposure control, adjustment of the mA and/or kV according to patient size and/or use of iterative reconstruction technique. CONTRAST:  75mL OMNIPAQUE IOHEXOL 300 MG/ML  SOLN COMPARISON:  OB ultrasound from same day. FINDINGS: Lower chest: No acute abnormality. Hepatobiliary: No focal liver abnormality is seen. No gallstones, gallbladder wall thickening, or biliary dilatation. Pancreas: Unremarkable. No pancreatic ductal dilatation or surrounding inflammatory changes. Spleen: Normal in size without focal abnormality. Adrenals/Urinary Tract: Adrenal glands are unremarkable. Kidneys are normal, without renal calculi, focal lesion, or hydronephrosis. Bladder is unremarkable. Stomach/Bowel: Stomach is within normal limits. Appendix appears normal. No evidence of bowel wall thickening, distention, or inflammatory changes. Vascular/Lymphatic: No significant vascular findings are present. No enlarged abdominal or  pelvic lymph nodes. Reproductive: Uterus and bilateral adnexa are unremarkable. Other: Trace intermediate density fluid in the  cul-de-sac. No pneumoperitoneum. Musculoskeletal: No acute or significant osseous findings. IMPRESSION: 1. Trace complex fluid in the pelvis, suspicious for hemorrhage. Differential considerations include ruptured ovarian cyst as well as ectopic pregnancy. Close clinical follow-up and serial beta HCG is recommended. Repeat pelvic ultrasound in 14 days is also suggested. 2. Normal appendix. Electronically Signed   By: Obie Dredge M.D.   On: 01/20/2024 09:53   US OB LESS THAN 14 WEEKS WITH OB TRANSVAGINAL Result Date: 01/20/2024 CLINICAL DATA:  Positive pregnancy test. Quantitative beta HCG is 162. Right lower quadrant pain. Last menstrual. 12/15/2023. EXAM: OBSTETRIC <14 WK [place] AND TRANSVAGINAL OB US TECHNIQUE: Both transabdominal and transvaginal ultrasound examinations were performed for complete evaluation of the gestation as well as the maternal uterus, adnexal regions, and pelvic cul-de-sac. Transvaginal technique was performed to assess early pregnancy. COMPARISON:  None Available. FINDINGS: Intrauterine gestational sac: None visualized. Yolk sac:  Not visualized. Embryo:  Not visualized. Subchorionic hemorrhage:  None visualized. Maternal uterus/adnexae: Uterus and adnexa are unremarkable. IMPRESSION: No intrauterine gestational sac visualized. No ectopic pregnancy visualized. In the appropriate clinical setting follow-up pelvic ultrasound at 10-14 days may be useful for further evaluation of this pregnancy. Electronically Signed   By: Marin Roberts M.D.   On: 01/20/2024 07:36    Assessment:   1. Right tubal pregnancy without intrauterine pregnancy      Plan: -Discharge home in stable condition -Ectopic precautions discussed -Patient advised to follow-up with [facility] in 1 week for HCG level and 2 weeks to see provider -Patient may return to MAU as needed or  if her condition were to change or worsen  Raelyn Mora, CNM 01/26/2024, 11:42 AM

## 2024-01-26 NOTE — MAU Note (Signed)
Pt here for repeat BHCG day 7 of methotrexate. Reports very mild pain and reports small to moderate vag bleeding but pt stated  it is like a period . BP 107/67   Pulse 75   Temp 98.4 F (36.9 C)   Resp 18   LMP 01/16/2024

## 2024-02-01 ENCOUNTER — Other Ambulatory Visit (HOSPITAL_BASED_OUTPATIENT_CLINIC_OR_DEPARTMENT_OTHER): Payer: Self-pay | Admitting: *Deleted

## 2024-02-01 DIAGNOSIS — O00101 Right tubal pregnancy without intrauterine pregnancy: Secondary | ICD-10-CM

## 2024-02-03 ENCOUNTER — Other Ambulatory Visit (HOSPITAL_BASED_OUTPATIENT_CLINIC_OR_DEPARTMENT_OTHER): Payer: Self-pay

## 2024-02-04 ENCOUNTER — Other Ambulatory Visit (HOSPITAL_BASED_OUTPATIENT_CLINIC_OR_DEPARTMENT_OTHER): Payer: Self-pay

## 2024-02-05 LAB — BETA HCG QUANT (REF LAB): hCG Quant: 4 m[IU]/mL

## 2024-02-07 ENCOUNTER — Encounter (HOSPITAL_BASED_OUTPATIENT_CLINIC_OR_DEPARTMENT_OTHER): Payer: Self-pay | Admitting: Obstetrics & Gynecology

## 2024-04-02 ENCOUNTER — Encounter (HOSPITAL_BASED_OUTPATIENT_CLINIC_OR_DEPARTMENT_OTHER): Payer: Self-pay | Admitting: Obstetrics & Gynecology

## 2024-04-04 ENCOUNTER — Other Ambulatory Visit (HOSPITAL_BASED_OUTPATIENT_CLINIC_OR_DEPARTMENT_OTHER): Payer: Self-pay | Admitting: *Deleted

## 2024-04-04 DIAGNOSIS — Z30011 Encounter for initial prescription of contraceptive pills: Secondary | ICD-10-CM

## 2024-04-04 MED ORDER — NORETHINDRONE 0.35 MG PO TABS: 1.0000 | ORAL_TABLET | Freq: Every day | ORAL | 1 refills | Status: DC

## 2024-06-23 ENCOUNTER — Encounter (HOSPITAL_BASED_OUTPATIENT_CLINIC_OR_DEPARTMENT_OTHER): Payer: Self-pay | Admitting: Obstetrics & Gynecology

## 2024-12-13 ENCOUNTER — Other Ambulatory Visit (HOSPITAL_BASED_OUTPATIENT_CLINIC_OR_DEPARTMENT_OTHER): Payer: Self-pay

## 2024-12-13 ENCOUNTER — Encounter (HOSPITAL_BASED_OUTPATIENT_CLINIC_OR_DEPARTMENT_OTHER): Payer: Self-pay | Admitting: Obstetrics & Gynecology

## 2024-12-13 DIAGNOSIS — Z30011 Encounter for initial prescription of contraceptive pills: Secondary | ICD-10-CM

## 2024-12-15 ENCOUNTER — Other Ambulatory Visit (HOSPITAL_BASED_OUTPATIENT_CLINIC_OR_DEPARTMENT_OTHER): Payer: Self-pay

## 2024-12-15 DIAGNOSIS — Z30011 Encounter for initial prescription of contraceptive pills: Secondary | ICD-10-CM

## 2024-12-15 MED ORDER — NORETHINDRONE 0.35 MG PO TABS: 1.0000 | ORAL_TABLET | Freq: Every day | ORAL | 1 refills | Status: AC
